# Patient Record
Sex: Male | Born: 1945 | Race: White | Hispanic: No | Marital: Married | Smoking: Never smoker
Health system: Southern US, Community
[De-identification: ages and names within clinical notes are randomized; demographics above are authoritative.]

## PROBLEM LIST (undated history)

## (undated) DIAGNOSIS — I10 Essential (primary) hypertension: Secondary | ICD-10-CM

---

## 2020-03-04 ENCOUNTER — Inpatient Hospital Stay (HOSPITAL_COMMUNITY)
Admission: EM | Admit: 2020-03-04 | Discharge: 2020-03-10 | DRG: 177 | Disposition: A | Discharge status: Home / Self Care | Payer: No Typology Code available for payment source | Attending: Internal Medicine | Admitting: Internal Medicine

## 2020-03-04 ENCOUNTER — Other Ambulatory Visit: Payer: Self-pay

## 2020-03-04 ENCOUNTER — Emergency Department (HOSPITAL_COMMUNITY): Payer: No Typology Code available for payment source

## 2020-03-04 DIAGNOSIS — J1282 Pneumonia due to coronavirus disease 2019: Secondary | ICD-10-CM | POA: Diagnosis present

## 2020-03-04 DIAGNOSIS — R0602 Shortness of breath: Secondary | ICD-10-CM

## 2020-03-04 DIAGNOSIS — J9600 Acute respiratory failure, unspecified whether with hypoxia or hypercapnia: Secondary | ICD-10-CM | POA: Diagnosis present

## 2020-03-04 DIAGNOSIS — E871 Hypo-osmolality and hyponatremia: Secondary | ICD-10-CM | POA: Diagnosis present

## 2020-03-04 DIAGNOSIS — R7989 Other specified abnormal findings of blood chemistry: Secondary | ICD-10-CM | POA: Diagnosis not present

## 2020-03-04 DIAGNOSIS — T380X5A Adverse effect of glucocorticoids and synthetic analogues, initial encounter: Secondary | ICD-10-CM | POA: Diagnosis not present

## 2020-03-04 DIAGNOSIS — Z6838 Body mass index (BMI) 38.0-38.9, adult: Secondary | ICD-10-CM | POA: Diagnosis not present

## 2020-03-04 DIAGNOSIS — E876 Hypokalemia: Secondary | ICD-10-CM | POA: Diagnosis present

## 2020-03-04 DIAGNOSIS — U071 COVID-19: Principal | ICD-10-CM

## 2020-03-04 DIAGNOSIS — I10 Essential (primary) hypertension: Secondary | ICD-10-CM | POA: Diagnosis present

## 2020-03-04 DIAGNOSIS — K219 Gastro-esophageal reflux disease without esophagitis: Secondary | ICD-10-CM | POA: Diagnosis present

## 2020-03-04 DIAGNOSIS — E860 Dehydration: Secondary | ICD-10-CM | POA: Diagnosis present

## 2020-03-04 DIAGNOSIS — Z23 Encounter for immunization: Secondary | ICD-10-CM | POA: Diagnosis not present

## 2020-03-04 DIAGNOSIS — D72829 Elevated white blood cell count, unspecified: Secondary | ICD-10-CM | POA: Diagnosis not present

## 2020-03-04 DIAGNOSIS — E785 Hyperlipidemia, unspecified: Secondary | ICD-10-CM | POA: Diagnosis present

## 2020-03-04 DIAGNOSIS — J9601 Acute respiratory failure with hypoxia: Secondary | ICD-10-CM | POA: Diagnosis present

## 2020-03-04 DIAGNOSIS — R0902 Hypoxemia: Secondary | ICD-10-CM

## 2020-03-04 DIAGNOSIS — E669 Obesity, unspecified: Secondary | ICD-10-CM | POA: Diagnosis present

## 2020-03-04 HISTORY — DX: Essential (primary) hypertension: I10

## 2020-03-04 LAB — COMPREHENSIVE METABOLIC PANEL
ALT: 33 U/L (ref 0–44)
AST: 69 U/L — ABNORMAL HIGH (ref 15–41)
Albumin: 3.2 g/dL — ABNORMAL LOW (ref 3.5–5.0)
Alkaline Phosphatase: 102 U/L (ref 38–126)
Anion gap: 15 (ref 5–15)
BUN: 9 mg/dL (ref 8–23)
CO2: 22 mmol/L (ref 22–32)
Calcium: 9.1 mg/dL (ref 8.9–10.3)
Chloride: 90 mmol/L — ABNORMAL LOW (ref 98–111)
Creatinine, Ser: 1.12 mg/dL (ref 0.61–1.24)
GFR, Estimated: >60 mL/min (ref >60)
Glucose, Bld: 99 mg/dL (ref 70–99)
Potassium: 3.3 mmol/L — ABNORMAL LOW (ref 3.5–5.1)
Sodium: 127 mmol/L — ABNORMAL LOW (ref 135–145)
Total Bilirubin: 1 mg/dL (ref 0.3–1.2)
Total Protein: 6.9 g/dL (ref 6.5–8.1)

## 2020-03-04 LAB — C-REACTIVE PROTEIN: CRP: 11.3 mg/dL — ABNORMAL HIGH (ref <1.0)

## 2020-03-04 LAB — CBC WITH DIFFERENTIAL/PLATELET
Abs Immature Granulocytes: 0 10*3/uL (ref 0.00–0.07)
Basophils Absolute: 0.1 10*3/uL (ref 0.0–0.1)
Basophils Relative: 1 %
Eosinophils Absolute: 0 10*3/uL (ref 0.0–0.5)
Eosinophils Relative: 0 %
HCT: 47.5 % (ref 39.0–52.0)
Hemoglobin: 16.3 g/dL (ref 13.0–17.0)
Lymphocytes Relative: 14 %
Lymphs Abs: 0.7 10*3/uL (ref 0.7–4.0)
MCH: 31 pg (ref 26.0–34.0)
MCHC: 34.3 g/dL (ref 30.0–36.0)
MCV: 90.3 fL (ref 80.0–100.0)
Monocytes Absolute: 0.3 10*3/uL (ref 0.1–1.0)
Monocytes Relative: 6 %
Neutro Abs: 4 10*3/uL (ref 1.7–7.7)
Neutrophils Relative %: 79 %
Platelets: 161 10*3/uL (ref 150–400)
RBC: 5.26 MIL/uL (ref 4.22–5.81)
RDW: 11.4 % — ABNORMAL LOW (ref 11.5–15.5)
WBC: 5.1 10*3/uL (ref 4.0–10.5)
nRBC: 0 % (ref 0.0–0.2)
nRBC: 0 /100 WBC

## 2020-03-04 LAB — FERRITIN: Ferritin: 1204 ng/mL — ABNORMAL HIGH (ref 24–336)

## 2020-03-04 LAB — D-DIMER, QUANTITATIVE: D-Dimer, Quant: 1.92 ug/mL-FEU — ABNORMAL HIGH (ref 0.00–0.50)

## 2020-03-04 LAB — RESP PANEL BY RT-PCR (FLU A&B, COVID) ARPGX2
Influenza A by PCR: NEGATIVE
Influenza B by PCR: NEGATIVE
SARS Coronavirus 2 by RT PCR: POSITIVE — AB

## 2020-03-04 LAB — FIBRINOGEN: Fibrinogen: 652 mg/dL — ABNORMAL HIGH (ref 210–475)

## 2020-03-04 LAB — LACTIC ACID, PLASMA: Lactic Acid, Venous: 1.7 mmol/L (ref 0.5–1.9)

## 2020-03-04 LAB — LACTATE DEHYDROGENASE: LDH: 480 U/L — ABNORMAL HIGH (ref 98–192)

## 2020-03-04 LAB — TRIGLYCERIDES: Triglycerides: 110 mg/dL (ref <150)

## 2020-03-04 MED ORDER — SODIUM CHLORIDE 0.9 % IV SOLN
100.0000 mg | Freq: Every day | INTRAVENOUS | Status: AC
  Administered 2020-03-05 – 2020-03-08 (×4): 100 mg via INTRAVENOUS
  Filled 2020-03-04 (×5): qty 20

## 2020-03-04 MED ORDER — SODIUM CHLORIDE 0.9 % IV SOLN
200.0000 mg | Freq: Once | INTRAVENOUS | Status: AC
  Administered 2020-03-05: 200 mg via INTRAVENOUS
  Filled 2020-03-04: qty 40

## 2020-03-04 MED ORDER — METHYLPREDNISOLONE SODIUM SUCC 125 MG IJ SOLR
50.0000 mg | Freq: Once | INTRAMUSCULAR | Status: AC
  Administered 2020-03-05: 50 mg via INTRAVENOUS
  Filled 2020-03-04: qty 2

## 2020-03-04 NOTE — ED Provider Notes (Signed)
[facility] EMERGENCY DEPARTMENT Provider Note   CSN: [id number] Arrival date & time: 03/04/20  1927     History Chief Complaint  Patient presents with  . Covid Exposure  . Shortness of Breath    Adam Edwards is a 74 y.o. male who presents to the ED today via EMS with complaint of gradual onset, constant, worsening, shortness of breath x 3 days. Per EMS pt's wife tested positive for COVID 19 last night; pt and wife are both unvaccinated. Pt complains of cough and fevers as well. No complains of chest pain, abdominal pain, diarrhea. 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 Pt is not normally on O2. He was placed on 12 L [place] with EMS and only satting about 84%. When pt came to the ED he was placed on 10L NRB with improvement in symptoms. EMS reports pt was febrile with them however did not receive any tylenol - he was provided with a small amount of fluids and when he came to the ED temp 99.4.   The history is provided by the patient, the EMS personnel and medical records.       No past medical history on file.  Patient Active Problem List   Diagnosis Date Noted  . Acute respiratory failure (HCC) 03/04/2020      No family history on file.  Social History   Tobacco Use  . Smoking status: Not on file  Substance Use Topics  . Alcohol use: Not on file  . Drug use: Not on file    Home Medications Prior to Admission medications   Not on File    Allergies    Patient has no allergy information on record.  Review of Systems   Review of Systems  Constitutional: Positive for fever.  Respiratory: Positive for cough and shortness of breath.   Cardiovascular: Negative for chest pain.  Gastrointestinal: Negative for abdominal pain and diarrhea.  All other systems reviewed and are negative.   Physical Exam Updated Vital Signs BP 106/74 (BP Location: Right Arm)   Pulse 74   Temp 99.4 F (37.4 C) (Oral)   Resp (!) 36    Ht 5\' 3"  (1.6 m)   Wt 99.8 kg   SpO2 95%   BMI 38.97 kg/m   Physical Exam Vitals and nursing note reviewed.  Constitutional:      Appearance: He is not ill-appearing or diaphoretic.  HENT:     Head: Normocephalic and atraumatic.  Eyes:     Conjunctiva/sclera: Conjunctivae normal.  Cardiovascular:     Rate and Rhythm: Normal rate and regular rhythm.     Pulses: Normal pulses.  Pulmonary:     Effort: Pulmonary effort is normal.     Breath sounds: Decreased breath sounds present. No wheezing, rhonchi or rales.     Comments: Speaking in 1 word sentences on 10L NRB, satting 91%. Diminished breath sounds throughout.  Abdominal:     Palpations: Abdomen is soft.     Tenderness: There is no abdominal tenderness.  Musculoskeletal:     Cervical back: Neck supple.  Skin:    General: Skin is warm and dry.  Neurological:     Mental Status: He is alert.     ED Results / Procedures / Treatments   Labs (all labs ordered are listed, but only abnormal results are displayed) Labs Reviewed  RESP PANEL BY RT-PCR (FLU A&B, COVID) ARPGX2 - Abnormal; Notable for the  following components:      Result Value   SARS Coronavirus 2 by RT PCR POSITIVE (*)    All other components within normal limits  CBC WITH DIFFERENTIAL/PLATELET - Abnormal; Notable for the following components:   RDW 11.4 (*)    All other components within normal limits  COMPREHENSIVE METABOLIC PANEL - Abnormal; Notable for the following components:   Sodium 127 (*)    Potassium 3.3 (*)    Chloride 90 (*)    Albumin 3.2 (*)    AST 69 (*)    All other components within normal limits  D-DIMER, QUANTITATIVE (NOT AT [facility]) - Abnormal; Notable for the following components:   D-Dimer, Quant 1.92 (*)    All other components within normal limits  LACTATE DEHYDROGENASE - Abnormal; Notable for the following components:   LDH 480 (*)    All other components within normal limits  FERRITIN - Abnormal; Notable for the following  components:   Ferritin 1,204 (*)    All other components within normal limits  FIBRINOGEN - Abnormal; Notable for the following components:   Fibrinogen 652 (*)    All other components within normal limits  C-REACTIVE PROTEIN - Abnormal; Notable for the following components:   CRP 11.3 (*)    All other components within normal limits  CULTURE, BLOOD (ROUTINE X 2)  CULTURE, BLOOD (ROUTINE X 2)  LACTIC ACID, PLASMA  TRIGLYCERIDES  LACTIC ACID, PLASMA  PROCALCITONIN    EKG None  Radiology DG Chest Port 1 View  Result Date: 03/04/2020 CLINICAL DATA:  Shortness of breath for 3 days. History of COVID-19 exposure. EXAM: PORTABLE CHEST 1 VIEW COMPARISON:  None. FINDINGS: The patient has bilateral airspace disease with a peripheral predominance. Heart size is normal. No pneumothorax or pleural effusion. IMPRESSION: Bilateral pneumonia with an appearance compatible with COVID-19 infection. Electronically Signed   By: Drusilla Kanner M.D.   On: 03/04/2020 20:32    Procedures .Critical Care Performed by: Tanda Rockers, PA-C Authorized by: Tanda Rockers, PA-C   Critical care provider statement:    Critical care time (minutes):  45   Critical care was necessary to treat or prevent imminent or life-threatening deterioration of the following conditions:  Respiratory failure   Critical care was time spent personally by me on the following activities:  Discussions with consultants, evaluation of patient's response to treatment, examination of patient, ordering and performing treatments and interventions, ordering and review of laboratory studies, ordering and review of radiographic studies, pulse oximetry, re-evaluation of patient's condition, obtaining history from patient or surrogate and review of old charts   (including critical care time)  Medications Ordered in ED Medications  methylPREDNISolone sodium succinate (SOLU-MEDROL) 125 mg/2 mL injection 50 mg (has no administration in time  range)    ED Course  I have reviewed the triage vital signs and the nursing notes.  Pertinent labs & imaging results that were available during my care of the patient were reviewed by me and considered in my medical decision making (see chart for details).  Clinical Course as of Mar 04 2258  Thu Mar 04, 2020  2227 SARS Coronavirus 2 by RT PCR(!): POSITIVE [MV]    Clinical Course User Index [MV] Tanda Rockers, PA-C   MDM Rules/Calculators/A&P                          74 year old male who presents to the ED today via EMS for worsening shortness of breath for  the past 3 days, wife recently tested positive for COVID-19 and patient began feeling ill 3 days ago with shortness of breath, cough, fevers.  He is unvaccinated.  Found to be hypoxic with fire department at 70% on room air, he presents to the ED on 12 L nasal cannula satting about 84%.  Quickly placed on 10 L nonrebreather with improvement.  Patient able speak in one-word sentences due to his significant shortness of breath.  On exam he has diminished breath sounds throughout, no wheezes, no rales, no rhonchi.  He denies any chest pain or abdominal pain.  On arrival his temp is 99.4, nontachycardic, tachypneic at 36.  Blood pressure slightly low at 106/74.  Will swab for Covid at this time and obtain Covid labs, strong suspicion given recent positive exposure with wife and patient being unvaccinated with symptoms of hypoxia.  Patient will need to be admitted.  CXR consistent with COVID pneumonia. Still awaiting COVID results at this time CBC without leukocytosis. Hgb stable at 16.3 CMP with sodium 127 and potassium 3.3, chloride 90. Do not have baseline to compare to. Creatinine within normal limits.  D dimer, fibrinogen, ferritin, and CRP elevated  COVID has returned positive Discussed case with [facility] Dr. Toniann Fail who agrees to accept patient for admission. Recommends 0.5 mg/kg solumedrol and remdisivir.   This note  was prepared using Dragon voice recognition software and may include unintentional dictation errors due to the inherent limitations of voice recognition software.  Christie Copley was evaluated in Emergency Department on 03/04/2020 for the symptoms described in the history of present illness. He was evaluated in the context of the global COVID-19 pandemic, which necessitated consideration that the patient might be at risk for infection with the SARS-CoV-2 virus that causes COVID-19. Institutional protocols and algorithms that pertain to the evaluation of patients at risk for COVID-19 are in a state of rapid change based on information released by regulatory bodies including the CDC and federal and state organizations. These policies and algorithms were followed during the patient's care in the ED.   Final Clinical Impression(s) / ED Diagnoses Final diagnoses:  COVID-19  Hypoxia    Rx / DC Orders ED Discharge Orders    None       Tanda Rockers, PA-C 03/04/20 2300    Benjiman Core, MD 03/05/20 [phone]

## 2020-03-04 NOTE — ED Triage Notes (Signed)
Pt arrives to ED BIB [facility] for [facility] after being exposed to wife who is covid positive. Per EMS pt began experiencing SHOB x3 days and it worsened today. Per EMS upon their arrival pt was sating at 75% RA. EMS placed pt on 6L [place], sats went up to 84% and pt was then put on 15L NRB bringing sats up to mid 90's. Upon arrival pt's sats were assessed on RA, sats dropped to 80%. Pt currently on 10L NRB with sats of 99%. Pt A/O x4. EDP at bedside upon pt's arrival.

## 2020-03-04 NOTE — ED Notes (Signed)
Pt's family member's have been updated on pt's care and status. Please call for any updates regarding pt. Turner Daniels (Daughter) [phone]. Adria Dill (Daughter) [phone].

## 2020-03-05 ENCOUNTER — Other Ambulatory Visit: Payer: Self-pay

## 2020-03-05 ENCOUNTER — Inpatient Hospital Stay (HOSPITAL_COMMUNITY): Payer: No Typology Code available for payment source

## 2020-03-05 ENCOUNTER — Encounter (HOSPITAL_COMMUNITY): Payer: Self-pay | Admitting: Internal Medicine

## 2020-03-05 DIAGNOSIS — J9601 Acute respiratory failure with hypoxia: Secondary | ICD-10-CM

## 2020-03-05 DIAGNOSIS — U071 COVID-19: Principal | ICD-10-CM

## 2020-03-05 DIAGNOSIS — J9600 Acute respiratory failure, unspecified whether with hypoxia or hypercapnia: Secondary | ICD-10-CM

## 2020-03-05 DIAGNOSIS — R7989 Other specified abnormal findings of blood chemistry: Secondary | ICD-10-CM

## 2020-03-05 DIAGNOSIS — I10 Essential (primary) hypertension: Secondary | ICD-10-CM

## 2020-03-05 LAB — CBC WITH DIFFERENTIAL/PLATELET
Abs Immature Granulocytes: 0.11 10*3/uL — ABNORMAL HIGH (ref 0.00–0.07)
Basophils Absolute: 0 10*3/uL (ref 0.0–0.1)
Basophils Relative: 1 %
Eosinophils Absolute: 0 10*3/uL (ref 0.0–0.5)
Eosinophils Relative: 0 %
HCT: 40 % (ref 39.0–52.0)
Hemoglobin: 14.8 g/dL (ref 13.0–17.0)
Immature Granulocytes: 3 %
Lymphocytes Relative: 14 %
Lymphs Abs: 0.6 10*3/uL — ABNORMAL LOW (ref 0.7–4.0)
MCH: 32 pg (ref 26.0–34.0)
MCHC: 37 g/dL — ABNORMAL HIGH (ref 30.0–36.0)
MCV: 86.6 fL (ref 80.0–100.0)
Monocytes Absolute: 0.4 10*3/uL (ref 0.1–1.0)
Monocytes Relative: 9 %
Neutro Abs: 3.3 10*3/uL (ref 1.7–7.7)
Neutrophils Relative %: 73 %
Platelets: 174 10*3/uL (ref 150–400)
RBC: 4.62 MIL/uL (ref 4.22–5.81)
RDW: 11.5 % (ref 11.5–15.5)
WBC: 4.4 10*3/uL (ref 4.0–10.5)
nRBC: 0 % (ref 0.0–0.2)

## 2020-03-05 LAB — COMPREHENSIVE METABOLIC PANEL
ALT: 30 U/L (ref 0–44)
AST: 59 U/L — ABNORMAL HIGH (ref 15–41)
Albumin: 2.7 g/dL — ABNORMAL LOW (ref 3.5–5.0)
Alkaline Phosphatase: 88 U/L (ref 38–126)
Anion gap: 14 (ref 5–15)
BUN: 11 mg/dL (ref 8–23)
CO2: 22 mmol/L (ref 22–32)
Calcium: 8.5 mg/dL — ABNORMAL LOW (ref 8.9–10.3)
Chloride: 91 mmol/L — ABNORMAL LOW (ref 98–111)
Creatinine, Ser: 1.09 mg/dL (ref 0.61–1.24)
GFR, Estimated: >60 mL/min (ref >60)
Glucose, Bld: 136 mg/dL — ABNORMAL HIGH (ref 70–99)
Potassium: 3.3 mmol/L — ABNORMAL LOW (ref 3.5–5.1)
Sodium: 127 mmol/L — ABNORMAL LOW (ref 135–145)
Total Bilirubin: 0.9 mg/dL (ref 0.3–1.2)
Total Protein: 5.8 g/dL — ABNORMAL LOW (ref 6.5–8.1)

## 2020-03-05 LAB — D-DIMER, QUANTITATIVE: D-Dimer, Quant: 2 ug/mL-FEU — ABNORMAL HIGH (ref 0.00–0.50)

## 2020-03-05 LAB — PROCALCITONIN
Procalcitonin: 0.19 ng/mL
Procalcitonin: 0.19 ng/mL

## 2020-03-05 LAB — C-REACTIVE PROTEIN: CRP: 11.1 mg/dL — ABNORMAL HIGH (ref <1.0)

## 2020-03-05 LAB — TROPONIN I (HIGH SENSITIVITY)
Troponin I (High Sensitivity): 33 ng/L — ABNORMAL HIGH (ref <18)
Troponin I (High Sensitivity): 36 ng/L — ABNORMAL HIGH (ref <18)

## 2020-03-05 LAB — LACTIC ACID, PLASMA: Lactic Acid, Venous: 1.7 mmol/L (ref 0.5–1.9)

## 2020-03-05 MED ORDER — IOHEXOL 350 MG/ML SOLN
60.0000 mL | Freq: Once | INTRAVENOUS | Status: AC | PRN
  Administered 2020-03-05: 60 mL via INTRAVENOUS

## 2020-03-05 MED ORDER — ENOXAPARIN SODIUM 40 MG/0.4ML ~~LOC~~ SOLN
40.0000 mg | SUBCUTANEOUS | Status: DC
  Administered 2020-03-05: 40 mg via SUBCUTANEOUS
  Filled 2020-03-05: qty 0.4

## 2020-03-05 MED ORDER — ACETAMINOPHEN 325 MG PO TABS
650.0000 mg | ORAL_TABLET | Freq: Four times a day (QID) | ORAL | Status: DC | PRN
  Administered 2020-03-05 – 2020-03-06 (×2): 650 mg via ORAL
  Filled 2020-03-05 (×2): qty 2

## 2020-03-05 MED ORDER — ACETAMINOPHEN 650 MG RE SUPP: 650.0000 mg | Freq: Four times a day (QID) | RECTAL | Status: DC | PRN

## 2020-03-05 MED ORDER — HYDROCOD POLST-CPM POLST ER 10-8 MG/5ML PO SUER
5.0000 mL | Freq: Two times a day (BID) | ORAL | Status: DC | PRN
  Administered 2020-03-06: 5 mL via ORAL
  Filled 2020-03-05: qty 5

## 2020-03-05 MED ORDER — METHYLPREDNISOLONE SODIUM SUCC 125 MG IJ SOLR
0.5000 mg/kg | Freq: Two times a day (BID) | INTRAMUSCULAR | Status: DC
  Administered 2020-03-05 – 2020-03-06 (×2): 50 mg via INTRAVENOUS
  Filled 2020-03-05 (×2): qty 2

## 2020-03-05 MED ORDER — ZINC SULFATE 220 (50 ZN) MG PO CAPS
220.0000 mg | ORAL_CAPSULE | Freq: Every day | ORAL | Status: DC
  Administered 2020-03-05 – 2020-03-10 (×6): 220 mg via ORAL
  Filled 2020-03-05 (×6): qty 1

## 2020-03-05 MED ORDER — PANTOPRAZOLE SODIUM 40 MG PO TBEC
40.0000 mg | DELAYED_RELEASE_TABLET | Freq: Every day | ORAL | Status: DC
  Administered 2020-03-05 – 2020-03-10 (×6): 40 mg via ORAL
  Filled 2020-03-05 (×6): qty 1

## 2020-03-05 MED ORDER — POTASSIUM CHLORIDE CRYS ER 20 MEQ PO TBCR
20.0000 meq | EXTENDED_RELEASE_TABLET | Freq: Once | ORAL | Status: DC
  Filled 2020-03-05: qty 1

## 2020-03-05 MED ORDER — SODIUM CHLORIDE 0.9 % IV SOLN: INTRAVENOUS | Status: DC

## 2020-03-05 MED ORDER — TOCILIZUMAB 400 MG/20ML IV SOLN
8.0000 mg/kg | Freq: Once | INTRAVENOUS | Status: AC
  Administered 2020-03-05: 798 mg via INTRAVENOUS
  Filled 2020-03-05: qty 39.9

## 2020-03-05 MED ORDER — PREDNISONE 20 MG PO TABS: 50.0000 mg | ORAL_TABLET | Freq: Every day | ORAL | Status: DC

## 2020-03-05 MED ORDER — GUAIFENESIN-DM 100-10 MG/5ML PO SYRP
10.0000 mL | ORAL_SOLUTION | ORAL | Status: DC | PRN
  Administered 2020-03-06: 10 mL via ORAL
  Filled 2020-03-05: qty 10

## 2020-03-05 MED ORDER — ASCORBIC ACID 500 MG PO TABS
500.0000 mg | ORAL_TABLET | Freq: Every day | ORAL | Status: DC
  Administered 2020-03-05 – 2020-03-10 (×6): 500 mg via ORAL
  Filled 2020-03-05 (×6): qty 1

## 2020-03-05 MED ORDER — HYDRALAZINE HCL 20 MG/ML IJ SOLN: 10.0000 mg | INTRAMUSCULAR | Status: DC | PRN

## 2020-03-05 MED ORDER — ENOXAPARIN SODIUM 40 MG/0.4ML ~~LOC~~ SOLN
40.0000 mg | Freq: Two times a day (BID) | SUBCUTANEOUS | Status: DC
  Administered 2020-03-06: 40 mg via SUBCUTANEOUS
  Filled 2020-03-05: qty 0.4

## 2020-03-05 MED ORDER — POTASSIUM CHLORIDE CRYS ER 20 MEQ PO TBCR
40.0000 meq | EXTENDED_RELEASE_TABLET | Freq: Once | ORAL | Status: AC
  Administered 2020-03-05: 40 meq via ORAL
  Filled 2020-03-05: qty 2

## 2020-03-05 NOTE — Progress Notes (Signed)
Bilateral lower extremity venous duplex has been completed. Preliminary results can be found in CV Proc through chart review.   03/05/20 3:22 PM Olen Cordial RVT

## 2020-03-05 NOTE — Progress Notes (Addendum)
PROGRESS NOTE                                                                                                                                                                                                             Patient Demographics:    Adam Edwards, is a 74 y.o. male, DOB - 02-Feb-1946, ZOX:[id number]  Outpatient Primary MD for the patient is Adam Schaumann, MD   Admit date - 03/04/2020   LOS - 1  Chief Complaint  Patient presents with  . Covid Exposure  . Shortness of Breath       Brief Narrative: Patient is a 74 y.o. male with PMHx of HTN-presented to the hospital with 3-day history of shortness of breath-patient was found to have severe hypoxic respiratory failure due to COVID-19 pneumonia requiring HFNC/NRB-subsequently admitted to the hospitalist service.  Patient's wife is also Covid positive.  COVID-19 vaccinated status: Unvaccinated  Significant Events: 12/2>> presented to [facility] for SOB-severe hypoxia due to COVID-19 infection.  Significant studies: 12/3>> CTA chest: No PE, extensive bilateral pulmonary infiltrates.  COVID-19 medications: Steroids: 12/2>> Remdesivir: 12/2>> Actemra: 12/2 x 1  Antibiotics: None  Microbiology data: 12/2 >>blood culture: No growth 12/3>> blood culture: Pending  Procedures: None  Consults: None  DVT prophylaxis: Prophylactic Lovenox at intermediate dosing.   Subjective:    Adam Edwards today appears comfortable-on 15 L of HFNC along with 15 L via NRB.   Assessment  & Plan :   Acute Hypoxic Resp Failure due to Covid 19 Viral pneumonia: Has severe hypoxemia due to severe parenchymal infiltrates due to COVID-19 pneumonia.  Continue steroids/Remdesivir.  S/p Actemra on admission.  No evidence of volume overload-does not require diuretics.  Fever: afebrile O2 requirements:  SpO2: 100 % O2 Flow Rate (L/min): 10 L/min   COVID-19 Labs: Recent Labs     03/04/20 1933 03/05/20 0401  DDIMER 1.92* 2.00*  FERRITIN 1,204*  --   LDH 480*  --   CRP 11.3* 11.1*    No results found for: BNP  Recent Labs  Lab 03/04/20 1933 03/05/20 0401  PROCALCITON 0.19 0.19    Lab Results  Component Value Date   SARSCOV2NAA POSITIVE (A) 03/04/2020     Prone/Incentive Spirometry: encouraged patient to lie prone for 3-4 hours at a time for a total of 16 hours a day, and to  encourage incentive spirometry use 3-4/hour.  Elevated D-dimer levels: Secondary to COVID-19 related inflammation-check lower extremity Dopplers, CTA chest negative.  Change Lovenox to twice daily dosing.  Hyponatremia: Etiology uncertain-either due to dehydration in the setting of COVID-19 infection or SIADH due to COVID-19.  Volume status is stable.  We will try gentle hydration overnight to see if sodium levels improve.  If not-we can consider further work-up and or treatment with [place].  Hypokalemia: Replete and recheck  HTN: BP stable-no indication for hypertensive at this point.  Unclear whether he is on antihypertensives at home.  Obesity: Estimated body mass index is 38.97 kg/m as calculated from the following:   Height as of this encounter: 5\' 3"  (1.6 m).   Weight as of this encounter: 99.8 kg.   GI prophylaxis: PPI  ABG: No results found for: PHART, PCO2ART, PO2ART, HCO3, TCO2, ACIDBASEDEF, O2SAT  Vent Settings: N/A  Condition - Extremely Guarded-very tenuous with risk for further deterioration  Family Communication  :  Daughter [phone])  updated over the phone on 12/3  Code Status :  Full Code  Diet :  Diet Order            Diet Heart Room service appropriate? Yes; Fluid consistency: Thin  Diet effective now                  Disposition Plan  :   Status is: Inpatient  Remains inpatient appropriate because:Inpatient level of care appropriate due to severity of illness   Dispo: The patient is from: Home               Anticipated d/c is to: Home              Anticipated d/c date is: > 3 days              Patient currently is not medically stable to d/c.  Barriers to discharge: Hypoxia requiring O2 supplementation/complete 5 days of IV Remdesivir  Antimicorbials  :    Anti-infectives (From admission, onward)   Start     Dose/Rate Route Frequency Ordered Stop   03/05/20 1000  remdesivir 100 mg in sodium chloride 0.9 % 100 mL IVPB       "Followed by" Linked Group Details   100 mg 200 mL/hr over 30 Minutes Intravenous Daily 03/04/20 2305 03/09/20 0959   03/04/20 2315  remdesivir 200 mg in sodium chloride 0.9% 250 mL IVPB       "Followed by" Linked Group Details   200 mg 580 mL/hr over 30 Minutes Intravenous Once 03/04/20 2305 03/05/20 0200      Inpatient Medications  Scheduled Meds: . vitamin C  500 mg Oral Daily  . enoxaparin (LOVENOX) injection  40 mg Subcutaneous Q24H  . methylPREDNISolone (SOLU-MEDROL) injection  0.5 mg/kg Intravenous Q12H   Followed by  . [START ON 03/08/2020] predniSONE  50 mg Oral Daily  . zinc sulfate  220 mg Oral Daily   Continuous Infusions: . remdesivir 100 mg in NS 100 mL Stopped (03/05/20 1021)   PRN Meds:.acetaminophen **OR** acetaminophen, chlorpheniramine-HYDROcodone, guaiFENesin-dextromethorphan, hydrALAZINE   Time Spent in minutes  35   See all Orders from today for further details   14/03/21 M.D on 03/05/2020 at 10:36 AM  To page go to [email or web address] - use universal password  [facility] -  Office  [phone]    Objective:   Vitals:   03/05/20 0915 03/05/20 0930 03/05/20 0945 03/05/20 1030  BP: 126/76 136/79  118/71 120/69  Pulse: 92 98 85 85  Resp: Temp:      TempSrc:      SpO2: 98% (!) 88% 99% 100%  Weight:      Height:        Wt Readings from Last 3 Encounters:  03/04/20 99.8 kg     Intake/Output Summary (Last 24 hours) at 03/05/2020 1036 Last data filed at 03/05/2020 1021 Gross per 24 hour  Intake 100  ml  Output --  Net 100 ml     Physical Exam Gen Exam:Alert awake-not in any distress HEENT:atraumatic, normocephalic Chest: B/L clear to auscultation anteriorly CVS:S1S2 regular Abdomen:soft non tender, non distended Extremities:no edema Neurology: Non focal Skin: no rash   Data Review:    CBC Recent Labs  Lab 03/04/20 1933 03/05/20 0401  WBC 5.1 4.4  HGB 16.3 14.8  HCT 47.5 40.0  PLT 161 174  MCV 90.3 86.6  MCH 31.0 32.0  MCHC 34.3 37.0*  RDW 11.4* 11.5  LYMPHSABS 0.7 0.6*  MONOABS 0.3 0.4  EOSABS 0.0 0.0  BASOSABS 0.1 0.0    Chemistries  Recent Labs  Lab 03/04/20 1933 03/05/20 0401  NA 127* 127*  K 3.3* 3.3*  CL 90* 91*  CO2 22 22  GLUCOSE 99 136*  BUN 9 11  CREATININE 1.12 1.09  CALCIUM 9.1 8.5*  AST 69* 59*  ALT 33 30  ALKPHOS 102 88  BILITOT 1.0 0.9   ------------------------------------------------------------------------------------------------------------------ Recent Labs    03/04/20 1933  TRIG 110    No results found for: HGBA1C ------------------------------------------------------------------------------------------------------------------ No results for input(s): TSH, T4TOTAL, T3FREE, THYROIDAB in the last 72 hours.  Invalid input(s): FREET3 ------------------------------------------------------------------------------------------------------------------ Recent Labs    03/04/20 1933  FERRITIN 1,204*    Coagulation profile No results for input(s): INR, PROTIME in the last 168 hours.  Recent Labs    03/04/20 1933 03/05/20 0401  DDIMER 1.92* 2.00*    Cardiac Enzymes No results for input(s): CKMB, TROPONINI, MYOGLOBIN in the last 168 hours.  Invalid input(s): CK ------------------------------------------------------------------------------------------------------------------ No results found for: BNP  Micro Results Recent Results (from the past 240 hour(s))  Resp Panel by RT-PCR (Flu A&B, Covid) Nasopharyngeal Swab      Status: Abnormal   Collection Time: 03/04/20  7:33 PM   Specimen: Nasopharyngeal Swab; Nasopharyngeal(NP) swabs in vial transport medium  Result Value Ref Range Status   SARS Coronavirus 2 by RT PCR POSITIVE (A) NEGATIVE Final    Comment: RESULT CALLED TO, READ BACK BY AND VERIFIED WITH: Andree Elk RN 03/04/20 AT 2153 SK (NOTE) SARS-CoV-2 target nucleic acids are DETECTED.  The SARS-CoV-2 RNA is generally detectable in upper respiratory specimens during the acute phase of infection. Positive results are indicative of the presence of the identified virus, but do not rule out bacterial infection or co-infection with other pathogens not detected by the test. Clinical correlation with patient history and other diagnostic information is necessary to determine patient infection status. The expected result is Negative.  Fact Sheet for Patients: BloggerCourse.com  Fact Sheet for Healthcare Providers: SeriousBroker.it  This test is not yet approved or cleared by the [place] FDA and  has been authorized for detection and/or diagnosis of SARS-CoV-2 by FDA under an Emergency Use Authorization (EUA).  This EUA will remain in effect (meaning this te st can be used) for the duration of  the COVID-19 declaration under Section 564(b)(1) of the Act, 21 U.S.C. section 360bbb-3(b)(1), unless the authorization is terminated  or revoked sooner.     Influenza A by PCR NEGATIVE NEGATIVE Final   Influenza B by PCR NEGATIVE NEGATIVE Final    Comment: (NOTE) The Xpert Xpress SARS-CoV-2/FLU/RSV plus assay is intended as an aid in the diagnosis of influenza from Nasopharyngeal swab specimens and should not be used as a sole basis for treatment. Nasal washings and aspirates are unacceptable for Xpert Xpress SARS-CoV-2/FLU/RSV testing.  Fact Sheet for Patients: BloggerCourse.com  Fact Sheet for Healthcare  Providers: SeriousBroker.it  This test is not yet approved or cleared by the [place] FDA and has been authorized for detection and/or diagnosis of SARS-CoV-2 by FDA under an Emergency Use Authorization (EUA). This EUA will remain in effect (meaning this test can be used) for the duration of the COVID-19 declaration under Section 564(b)(1) of the Act, 21 U.S.C. section 360bbb-3(b)(1), unless the authorization is terminated or revoked.  Performed at [facility], [address], [place], [place] [address]   Blood Culture (routine x 2)     Status: None (Preliminary result)   Collection Time: 03/04/20  8:45 PM   Specimen: BLOOD LEFT HAND  Result Value Ref Range Status   Specimen Description BLOOD LEFT HAND  Final   Special Requests   Final    AEROBIC BOTTLE ONLY Blood Culture results may not be optimal due to an inadequate volume of blood received in culture bottles   Culture   Final    NO GROWTH < 12 HOURS Performed at [facility], [address] [place], [place] [address]    Report Status PENDING  Incomplete    Radiology Reports CT ANGIO CHEST PE W OR WO CONTRAST  Result Date: 03/05/2020 CLINICAL DATA:  Pulmonary embolism, dyspnea, COVID pneumonia EXAM: CT ANGIOGRAPHY CHEST WITH CONTRAST TECHNIQUE: Multidetector CT imaging of the chest was performed using the standard protocol during bolus administration of intravenous contrast. Multiplanar CT image reconstructions and MIPs were obtained to evaluate the vascular anatomy. CONTRAST:  42mL OMNIPAQUE IOHEXOL 350 MG/ML SOLN COMPARISON:  None. FINDINGS: Cardiovascular: There is adequate opacification of the pulmonary arterial tree. No intraluminal filling defect identified to suggest acute pulmonary embolism. The pulmonary arteries are of normal caliber. Mild coronary artery calcification. Global cardiac size within normal limits. No pericardial effusion. Thoracic aorta demonstrates mild  atherosclerotic calcification within the arch. No aortic aneurysm. Mediastinum/Nodes: No enlarged mediastinal, hilar, or axillary lymph nodes. Thyroid gland, trachea, and esophagus demonstrate no significant findings. Small hiatal hernia noted. Lungs/Pleura: There is extensive bilateral asymmetric diffuse ground-glass pulmonary infiltrate and peripheral areas of consolidation in keeping with changes of atypical infection in the acute setting and typical of that seen with COVID-19 pneumonia. There is moderate to severe parenchymal involvement. No pneumothorax or pleural effusion. No central obstructing lesion. Upper Abdomen: No acute abnormality. Musculoskeletal: No chest wall abnormality. No acute or significant osseous findings. Review of the MIP images confirms the above findings. IMPRESSION: No pulmonary embolism. Asymmetric bilateral pulmonary infiltrate infiltrates in keeping with atypical infection and typical of that seen with acute COVID-19 pneumonia. Moderate to severe parenchymal involvement. Mild coronary artery calcification. Aortic Atherosclerosis (ICD10-I70.0). Electronically Signed   By: Helyn Numbers MD   On: 03/05/2020 06:57   DG Chest Port 1 View  Result Date: 03/04/2020 CLINICAL DATA:  Shortness of breath for 3 days. History of COVID-19 exposure. EXAM: PORTABLE CHEST 1 VIEW COMPARISON:  None. FINDINGS: The patient has bilateral airspace disease with a peripheral predominance. Heart size is normal. No pneumothorax or  pleural effusion. IMPRESSION: Bilateral pneumonia with an appearance compatible with COVID-19 infection. Electronically Signed   By: Drusilla Kannerhomas  Dalessio M.D.   On: 03/04/2020 20:32

## 2020-03-05 NOTE — ED Notes (Signed)
Pt resting in prone position on non rebreather with O2 sats @ 100%. Pt states he feels "better than I did". Denies any pain or comfort needs from this nurse.

## 2020-03-05 NOTE — ED Notes (Signed)
Breakfast Ordered 

## 2020-03-05 NOTE — H&P (Signed)
History and Physical    Adam Edwards GGE:[id number] DOB: 1945-12-15 DOA: 03/04/2020  PCP: Dennard Schaumann, MD  Patient coming from: Home.  Chief Complaint: Shortness of breath.  HPI: Adam Edwards is a 74 y.o. male with history of hypertension presents to the ER because of worsening shortness of breath for the last 3 days.  Patient states his wife was diagnosed with COVID-19 infection last week.  Patient denies any chest pain has been having some nonproductive cough and shortness of breath even at rest.  Has not been vaccinated against COVID-19 infection.  ED Course: In the ER patient was appearing mildly short of breath requiring high flow oxygen 15 L to maintain sats.  Chest x-ray shows bilateral infiltrates.  Labs are significant for sodium of 127 potassium 3.3 CRP of 11.3 D-dimer of 1.9.  Patient is being admitted for acute respiratory failure with hypoxia secondary to COVID-19 infection.  Started on IV Solu-Medrol and remdesivir.  Review of Systems: As per HPI, rest all negative.   Past Medical History:  Diagnosis Date  . Hypertension     History reviewed. No pertinent surgical history.   reports that he has never smoked. He has never used smokeless tobacco. He reports that he does not drink alcohol. No history on file for drug use.  Not on File  Family History  Problem Relation Age of Onset  . Diabetes Mellitus II Neg Hx     Prior to Admission medications   Not on File    Physical Exam: Constitutional: Moderately built and nourished. Vitals:   03/05/20 0130 03/05/20 0200 03/05/20 0215 03/05/20 0230  BP: (!) 146/71 140/70 136/73 123/66  Pulse: 75 68 77 62  Resp: (!) 22 (!) 34 (!) 24 (!) 26  Temp:      TempSrc:      SpO2: 92% 94% 95% 97%  Weight:      Height:       Eyes: Anicteric no pallor. ENMT: No discharge from the ears eyes nose or mouth. Neck: No mass felt.  No neck rigidity. Respiratory: No rhonchi or crepitations. Cardiovascular: S1-S2  heard. Abdomen: Soft nontender bowel sounds present. Musculoskeletal: No edema. Skin: No rash. Neurologic: Alert awake oriented to time place and person.  Moves all extremities. Psychiatric: Appears normal.  Normal affect.   Labs on Admission: I have personally reviewed following labs and imaging studies  CBC: Recent Labs  Lab 03/04/20 1933  WBC 5.1  NEUTROABS 4.0  HGB 16.3  HCT 47.5  MCV 90.3  PLT 161   Basic Metabolic Panel: Recent Labs  Lab 03/04/20 1933  NA 127*  K 3.3*  CL 90*  CO2 22  GLUCOSE 99  BUN 9  CREATININE 1.12  CALCIUM 9.1   GFR: Estimated Creatinine Clearance: 62.5 mL/min (by C-G formula based on SCr of 1.12 mg/dL). Liver Function Tests: Recent Labs  Lab 03/04/20 1933  AST 69*  ALT 33  ALKPHOS 102  BILITOT 1.0  PROT 6.9  ALBUMIN 3.2*   No results for input(s): LIPASE, AMYLASE in the last 168 hours. No results for input(s): AMMONIA in the last 168 hours. Coagulation Profile: No results for input(s): INR, PROTIME in the last 168 hours. Cardiac Enzymes: No results for input(s): CKTOTAL, CKMB, CKMBINDEX, TROPONINI in the last 168 hours. BNP (last 3 results) No results for input(s): PROBNP in the last 8760 hours. HbA1C: No results for input(s): HGBA1C in the last 72 hours. CBG: No results for input(s): GLUCAP in the last 168 hours.  Lipid Profile: Recent Labs    03/04/20 1933  TRIG 110   Thyroid Function Tests: No results for input(s): TSH, T4TOTAL, FREET4, T3FREE, THYROIDAB in the last 72 hours. Anemia Panel: Recent Labs    03/04/20 1933  FERRITIN 1,204*   Urine analysis: No results found for: COLORURINE, APPEARANCEUR, LABSPEC, PHURINE, GLUCOSEU, HGBUR, BILIRUBINUR, KETONESUR, PROTEINUR, UROBILINOGEN, NITRITE, LEUKOCYTESUR Sepsis Labs: @LABRCNTIP (procalcitonin:4,lacticidven:4) ) Recent Results (from the past 240 hour(s))  Resp Panel by RT-PCR (Flu A&B, Covid) Nasopharyngeal Swab     Status: Abnormal   Collection Time: 03/04/20   7:33 PM   Specimen: Nasopharyngeal Swab; Nasopharyngeal(NP) swabs in vial transport medium  Result Value Ref Range Status   SARS Coronavirus 2 by RT PCR POSITIVE (A) NEGATIVE Final    Comment: RESULT CALLED TO, READ BACK BY AND VERIFIED WITH: [place] 14/02/21 RN 03/04/20 AT 2153 SK (NOTE) SARS-CoV-2 target nucleic acids are DETECTED.  The SARS-CoV-2 RNA is generally detectable in upper respiratory specimens during the acute phase of infection. Positive results are indicative of the presence of the identified virus, but do not rule out bacterial infection or co-infection with other pathogens not detected by the test. Clinical correlation with patient history and other diagnostic information is necessary to determine patient infection status. The expected result is Negative.  Fact Sheet for Patients: 2154  Fact Sheet for Healthcare Providers: BloggerCourse.com  This test is not yet approved or cleared by the [employer] FDA and  has been authorized for detection and/or diagnosis of SARS-CoV-2 by FDA under an Emergency Use Authorization (EUA).  This EUA will remain in effect (meaning this te st can be used) for the duration of  the COVID-19 declaration under Section 564(b)(1) of the Act, 21 U.S.C. section 360bbb-3(b)(1), unless the authorization is terminated or revoked sooner.     Influenza A by PCR NEGATIVE NEGATIVE Final   Influenza B by PCR NEGATIVE NEGATIVE Final    Comment: (NOTE) The Xpert Xpress SARS-CoV-2/FLU/RSV plus assay is intended as an aid in the diagnosis of influenza from Nasopharyngeal swab specimens and should not be used as a sole basis for treatment. Nasal washings and aspirates are unacceptable for Xpert Xpress SARS-CoV-2/FLU/RSV testing.  Fact Sheet for Patients: [place]  Fact Sheet for Healthcare  Providers: BloggerCourse.com  This test is not yet approved or cleared by the [employer] FDA and has been authorized for detection and/or diagnosis of SARS-CoV-2 by FDA under an Emergency Use Authorization (EUA). This EUA will remain in effect (meaning this test can be used) for the duration of the COVID-19 declaration under Section 564(b)(1) of the Act, 21 U.S.C. section 360bbb-3(b)(1), unless the authorization is terminated or revoked.  Performed at [facility], [address] [place], [place]      Radiological Exams on Admission: DG Chest Port 1 View  Result Date: 03/04/2020 CLINICAL DATA:  Shortness of breath for 3 days. History of COVID-19 exposure. EXAM: PORTABLE CHEST 1 VIEW COMPARISON:  None. FINDINGS: The patient has bilateral airspace disease with a peripheral predominance. Heart size is normal. No pneumothorax or pleural effusion. IMPRESSION: Bilateral pneumonia with an appearance compatible with COVID-19 infection. Electronically Signed   By: 14/05/2019 M.D.   On: 03/04/2020 20:32     Assessment/Plan Principal Problem:   COVID-19 Active Problems:   Acute respiratory failure (HCC)   Essential hypertension    1. Acute respiratory failure with hypoxia presently on high flow oxygen 15 L to maintain sats.  Secondary to COVID-19 infection  for which patient has been started on IV Solu-Medrol and remdesivir.  I discussed with patient about using Actemra after discussing about its side effects and contraindication patient has consented.  I have ordered Actemra.  Follow patient's respiratory status and inflammatory markers.  CT angiogram of the chest is pending. 2. Hyponatremia and hypokalemia could be from diuretic use.  Replace and recheck.  Follow metabolic panel. 3. Hypertension Home medication is not clear.  But patient states he probably uses diuretic.  For now I kept patient as needed IV hydralazine.  Since patient has  acute respiratory failure with hypoxia secondary to COVID-19 infection will need inpatient status.   DVT prophylaxis: Lovenox. Code Status: Full code. Family Communication: Discussed with patient. Disposition Plan: Home. Consults called: None. Admission status: Inpatient.   Eduard Clos MD [facility] Pager [phone].  If 7PM-7AM, please contact night-coverage [email or web address] Password [email or web address]  03/05/2020, 2:57 AM

## 2020-03-05 NOTE — ED Notes (Signed)
Pt educated on self proning. Pt able to prone with non rebreather mask on. O2 is 99%.

## 2020-03-06 ENCOUNTER — Inpatient Hospital Stay (HOSPITAL_COMMUNITY): Payer: No Typology Code available for payment source

## 2020-03-06 LAB — COMPREHENSIVE METABOLIC PANEL
ALT: 40 U/L (ref 0–44)
AST: 87 U/L — ABNORMAL HIGH (ref 15–41)
Albumin: 2.6 g/dL — ABNORMAL LOW (ref 3.5–5.0)
Alkaline Phosphatase: 93 U/L (ref 38–126)
Anion gap: 14 (ref 5–15)
BUN: 16 mg/dL (ref 8–23)
CO2: 22 mmol/L (ref 22–32)
Calcium: 9.1 mg/dL (ref 8.9–10.3)
Chloride: 96 mmol/L — ABNORMAL LOW (ref 98–111)
Creatinine, Ser: 1.13 mg/dL (ref 0.61–1.24)
GFR, Estimated: >60 mL/min (ref >60)
Glucose, Bld: 174 mg/dL — ABNORMAL HIGH (ref 70–99)
Potassium: 3.4 mmol/L — ABNORMAL LOW (ref 3.5–5.1)
Sodium: 132 mmol/L — ABNORMAL LOW (ref 135–145)
Total Bilirubin: 1 mg/dL (ref 0.3–1.2)
Total Protein: 5.8 g/dL — ABNORMAL LOW (ref 6.5–8.1)

## 2020-03-06 LAB — C-REACTIVE PROTEIN: CRP: 9.6 mg/dL — ABNORMAL HIGH (ref <1.0)

## 2020-03-06 LAB — MRSA PCR SCREENING: MRSA by PCR: NEGATIVE

## 2020-03-06 LAB — CBC
HCT: 42.6 % (ref 39.0–52.0)
Hemoglobin: 15.4 g/dL (ref 13.0–17.0)
MCH: 31 pg (ref 26.0–34.0)
MCHC: 36.2 g/dL — ABNORMAL HIGH (ref 30.0–36.0)
MCV: 85.7 fL (ref 80.0–100.0)
Platelets: 194 10*3/uL (ref 150–400)
RBC: 4.97 MIL/uL (ref 4.22–5.81)
RDW: 11.5 % (ref 11.5–15.5)
WBC: 6.8 10*3/uL (ref 4.0–10.5)
nRBC: 0 % (ref 0.0–0.2)

## 2020-03-06 LAB — D-DIMER, QUANTITATIVE: D-Dimer, Quant: 3.18 ug/mL-FEU — ABNORMAL HIGH (ref 0.00–0.50)

## 2020-03-06 LAB — BRAIN NATRIURETIC PEPTIDE: B Natriuretic Peptide: 93.1 pg/mL (ref 0.0–100.0)

## 2020-03-06 MED ORDER — ENOXAPARIN SODIUM 60 MG/0.6ML ~~LOC~~ SOLN
0.5000 mg/kg | Freq: Two times a day (BID) | SUBCUTANEOUS | Status: DC
  Administered 2020-03-06 – 2020-03-09 (×8): 50 mg via SUBCUTANEOUS
  Filled 2020-03-06: qty 0.6
  Filled 2020-03-06: qty 0.5
  Filled 2020-03-06 (×2): qty 0.6
  Filled 2020-03-06: qty 0.5
  Filled 2020-03-06 (×4): qty 0.6

## 2020-03-06 MED ORDER — PNEUMOCOCCAL VAC POLYVALENT 25 MCG/0.5ML IJ INJ
0.5000 mL | INJECTION | INTRAMUSCULAR | Status: AC
  Administered 2020-03-07: 0.5 mL via INTRAMUSCULAR
  Filled 2020-03-06: qty 0.5

## 2020-03-06 MED ORDER — AMLODIPINE BESYLATE 10 MG PO TABS
10.0000 mg | ORAL_TABLET | Freq: Every day | ORAL | Status: DC
  Administered 2020-03-06 – 2020-03-10 (×5): 10 mg via ORAL
  Filled 2020-03-06 (×4): qty 1
  Filled 2020-03-06: qty 2

## 2020-03-06 MED ORDER — LEVOTHYROXINE SODIUM 100 MCG PO TABS
100.0000 ug | ORAL_TABLET | Freq: Every day | ORAL | Status: DC
  Administered 2020-03-06 – 2020-03-10 (×5): 100 ug via ORAL
  Filled 2020-03-06 (×5): qty 1

## 2020-03-06 MED ORDER — METHYLPREDNISOLONE SODIUM SUCC 125 MG IJ SOLR
80.0000 mg | Freq: Two times a day (BID) | INTRAMUSCULAR | Status: DC
Start: 2020-03-06 — End: 2020-03-06

## 2020-03-06 MED ORDER — METHYLPREDNISOLONE SODIUM SUCC 125 MG IJ SOLR
60.0000 mg | Freq: Two times a day (BID) | INTRAMUSCULAR | Status: DC
  Administered 2020-03-06 – 2020-03-07 (×4): 60 mg via INTRAVENOUS
  Filled 2020-03-06 (×4): qty 2

## 2020-03-06 MED ORDER — SODIUM CHLORIDE 0.9 % IV SOLN: INTRAVENOUS | Status: AC

## 2020-03-06 MED ORDER — FLUOXETINE HCL 20 MG PO CAPS
20.0000 mg | ORAL_CAPSULE | Freq: Every day | ORAL | Status: DC
  Administered 2020-03-06 – 2020-03-09 (×4): 20 mg via ORAL
  Filled 2020-03-06 (×5): qty 1

## 2020-03-06 MED ORDER — ATORVASTATIN CALCIUM 40 MG PO TABS
40.0000 mg | ORAL_TABLET | Freq: Every day | ORAL | Status: DC
  Administered 2020-03-06 – 2020-03-10 (×4): 40 mg via ORAL
  Filled 2020-03-06 (×5): qty 1

## 2020-03-06 NOTE — ED Notes (Signed)
Noted pt to be 88% on 3lpm via [place]. Placed back on NRB at 15lpm with O2 sat at 100%

## 2020-03-06 NOTE — Progress Notes (Signed)
Arek Spadafore [id number]  Code Status: FULL Admission Data: 03/06/2020 5:59 PM  Attending Provider: Jaking Thayer is a 74 y.o. male patient admitted from ED awake, alert - oriented X 4 - no acute distress noted. VSS - Blood pressure 122/73, pulse 79, temperature 98.2 F (36.8 C), temperature source Oral, resp. rate 20, height 5\' 3"  (1.6 m), weight 99.8 kg, SpO2 100 %. on NRB. No c/o chest pain. Cardiac tele # [id number], in place.  Admission INP armband ID verified with patient/family, and in place.  Fall assessment complete, with patient and family able to verbalize understanding of risk associated with falls, and verbalized understanding to call nsg before up out of bed. Call light within reach, patient able to voice, and demonstrate understanding. Skin, clean-dry- intact without evidence of bruising, or skin tears.  No evidence of skin break down noted on exam.  ?  Will cont to eval and treat per MD orders.  , RN

## 2020-03-06 NOTE — ED Notes (Signed)
Please call daughter Turner Daniels [phone]--advised it has been more than 24 hours since they have had an update please call asap.

## 2020-03-06 NOTE — Progress Notes (Signed)
PROGRESS NOTE                                                                                                                                                                                                             Patient Demographics:    Adam Edwards, is a 74 y.o. male, DOB - [birth year], JKK:[id number]  Outpatient Primary MD for the patient is Dennard Schaumann, MD    LOS - 2  Admit date - 03/04/2020    Chief Complaint  Patient presents with  . Covid Exposure  . Shortness of Breath       Brief Narrative (HPI from H&P)  - Adam Edwards is a 74 y.o. male with history of hypertension presents to the ER because of worsening shortness of breath for the last 3 days prior to hospital visit, in the ER he was diagnosed with severe acute hypoxic respiratory failure due to COVID-19 pneumonia and admitted to the hospital.   Subjective:    Adam Edwards today has, No headache, No chest pain, No abdominal pain - No Nausea, No new weakness tingling or numbness, +ve SOB.   Assessment  & Plan :     1. Acute Hypoxic Resp. Failure due to Acute Covid 19 Viral Pneumonitis during the ongoing 2020 Covid 19 Pandemic - he is unfortunately unvaccinated and has incurred severe parenchymal lung injury due to COVID-19 pneumonia, he has been treated with IV steroids, Remdesivir and Actemra combination.  Overall tenuous.  Will monitor closely.  Encouraged the patient to sit up in chair in the daytime use I-S and flutter valve for pulmonary toiletry and then prone in bed when at night.  Will advance activity and titrate down oxygen as possible.  Actemra/Baricitinib  off label use - patient was told that if COVID-19 pneumonitis gets worse we might potentially use Actemra off label, patient denies any known history of active diverticulitis, tuberculosis or hepatitis, understands the risks and benefits and wants to proceed with Actemra treatment if  required.     SpO2: 91 % O2 Flow Rate (L/min): 15 L/min  Recent Labs  Lab 03/04/20 1933 03/04/20 2103 03/05/20 0106 03/05/20 0401 03/06/20 0227 03/06/20 0229  WBC 5.1  --   --  4.4 6.8  --   HGB 16.3  --   --  14.8 15.4  --   HCT  47.5  --   --  40.0 42.6  --   PLT 161  --   --  174 194  --   CRP 11.3*  --   --  11.1* 9.6*  --   BNP  --   --   --   --   --  93.1  DDIMER 1.92*  --   --  2.00* 3.18*  --   PROCALCITON 0.19  --   --  0.19  --   --   AST 69*  --   --  59* 87*  --   ALT 33  --   --  30 40  --   ALKPHOS 102  --   --  88 93  --   BILITOT 1.0  --   --  0.9 1.0  --   ALBUMIN 3.2*  --   --  2.7* 2.6*  --   LATICACIDVEN  --  1.7 1.7  --   --   --   SARSCOV2NAA POSITIVE*  --   --   --   --   --     2.  Essential hypertension.  Currently stable on Norvasc monitor and adjust.  3.  Dyslipidemia.  On statin.  4.  GERD.  On PPI.      Condition - Extremely Guarded  Family Communication  : Updated daughter Toniann Fail [phone] on 03/06/20  Code Status :  Full  Consults  :  None  Procedures  :   CT -  No pulmonary embolism. Asymmetric bilateral pulmonary infiltrate infiltrates in keeping with atypical infection and typical of that seen with acute COVID-19 pneumonia. Moderate to severe parenchymal involvement. Mild coronary artery calcification. Aortic Atherosclerosis (ICD10-I70.0).    PUD Prophylaxis : PPI  Disposition Plan  :    Status is: Inpatient  Remains inpatient appropriate because:IV treatments appropriate due to intensity of illness or inability to take PO   Dispo: The patient is from: Home              Anticipated d/c is to: Home              Anticipated d/c date is: > 3 days              Patient currently is not medically stable to d/c.   DVT Prophylaxis  :  Lovenox    Lab Results  Component Value Date   PLT 194 03/06/2020    Diet :  Diet Order            Diet Heart Room service appropriate? Yes; Fluid consistency: Thin  Diet  effective now                  Inpatient Medications  Scheduled Meds: . amLODipine  10 mg Oral Daily  . vitamin C  500 mg Oral Daily  . atorvastatin  40 mg Oral QHS  . enoxaparin (LOVENOX) injection  0.5 mg/kg Subcutaneous BID  . FLUoxetine  20 mg Oral Daily  . levothyroxine  100 mcg Oral QAC breakfast  . methylPREDNISolone (SOLU-MEDROL) injection  60 mg Intravenous BID  . pantoprazole  40 mg Oral Q1200  . zinc sulfate  220 mg Oral Daily   Continuous Infusions: . sodium chloride 75 mL/hr at 03/06/20 1117  . remdesivir 100 mg in NS 100 mL Stopped (03/05/20 1021)   PRN Meds:.chlorpheniramine-HYDROcodone, guaiFENesin-dextromethorphan, hydrALAZINE  Antibiotics  :    Anti-infectives (From admission, onward)   Start  Dose/Rate Route Frequency Ordered Stop   03/05/20 1000  remdesivir 100 mg in sodium chloride 0.9 % 100 mL IVPB       "Followed by" Linked Group Details   100 mg 200 mL/hr over 30 Minutes Intravenous Daily 03/04/20 2305 03/09/20 0959   03/04/20 2315  remdesivir 200 mg in sodium chloride 0.9% 250 mL IVPB       "Followed by" Linked Group Details   200 mg 580 mL/hr over 30 Minutes Intravenous Once 03/04/20 2305 03/05/20 0200       Time Spent in minutes  30   Susa Raring M.D on 03/06/2020 at 11:27 AM  To page go to [email or web address] - password [facility]  [facility] -  Office  [phone]   See all Orders from today for further details    Objective:   Vitals:   03/06/20 0930 03/06/20 1015 03/06/20 1100 03/06/20 1121  BP: 117/66  133/79 133/79  Pulse: 82 91 93   Resp: (!) 22 (!) 24 (!) 28   Temp:      TempSrc:      SpO2: 100% 96% 91%   Weight:      Height:        Wt Readings from Last 3 Encounters:  03/04/20 99.8 kg     Intake/Output Summary (Last 24 hours) at 03/06/2020 1127 Last data filed at 03/06/2020 1011 Gross per 24 hour  Intake 1215.15 ml  Output 600 ml  Net 615.15 ml     Physical Exam  Awake Alert, No new F.N deficits,  Normal affect [place].AT,PERRAL Supple Neck,No JVD, No cervical lymphadenopathy appriciated.  Symmetrical Chest wall movement, Good air movement bilaterally, CTAB RRR,No Gallops,Rubs or new Murmurs, No Parasternal Heave +ve B.Sounds, Abd Soft, No tenderness, No organomegaly appriciated, No rebound - guarding or rigidity. No Cyanosis, Clubbing or edema, No new Rash or bruise       Data Review:    CBC Recent Labs  Lab 03/04/20 1933 03/05/20 0401 03/06/20 0227  WBC 5.1 4.4 6.8  HGB 16.3 14.8 15.4  HCT 47.5 40.0 42.6  PLT 161 174 194  MCV 90.3 86.6 85.7  MCH 31.0 32.0 31.0  MCHC 34.3 37.0* 36.2*  RDW 11.4* 11.5 11.5  LYMPHSABS 0.7 0.6*  --   MONOABS 0.3 0.4  --   EOSABS 0.0 0.0  --   BASOSABS 0.1 0.0  --     Recent Labs  Lab 03/04/20 1933 03/04/20 2103 03/05/20 0106 03/05/20 0401 03/06/20 0227 03/06/20 0229  NA 127*  --   --  127* 132*  --   K 3.3*  --   --  3.3* 3.4*  --   CL 90*  --   --  91* 96*  --   CO2 22  --   --  22 22  --   GLUCOSE 99  --   --  136* 174*  --   BUN 9  --   --  11 16  --   CREATININE 1.12  --   --  1.09 1.13  --   CALCIUM 9.1  --   --  8.5* 9.1  --   AST 69*  --   --  59* 87*  --   ALT 33  --   --  30 40  --   ALKPHOS 102  --   --  88 93  --   BILITOT 1.0  --   --  0.9 1.0  --   ALBUMIN 3.2*  --   --  2.7* 2.6*  --   CRP 11.3*  --   --  11.1* 9.6*  --   DDIMER 1.92*  --   --  2.00* 3.18*  --   PROCALCITON 0.19  --   --  0.19  --   --   LATICACIDVEN  --  1.7 1.7  --   --   --   BNP  --   --   --   --   --  93.1    ------------------------------------------------------------------------------------------------------------------ Recent Labs    03/04/20 1933  TRIG 110    No results found for: HGBA1C ------------------------------------------------------------------------------------------------------------------ No results for input(s): TSH, T4TOTAL, T3FREE, THYROIDAB in the last 72 hours.  Invalid input(s): FREET3  Cardiac  Enzymes No results for input(s): CKMB, TROPONINI, MYOGLOBIN in the last 168 hours.  Invalid input(s): CK ------------------------------------------------------------------------------------------------------------------    Component Value Date/Time   BNP 93.1 03/06/2020 0229    Micro Results Recent Results (from the past 240 hour(s))  Resp Panel by RT-PCR (Flu A&B, Covid) Nasopharyngeal Swab     Status: Abnormal   Collection Time: 03/04/20  7:33 PM   Specimen: Nasopharyngeal Swab; Nasopharyngeal(NP) swabs in vial transport medium  Result Value Ref Range Status   SARS Coronavirus 2 by RT PCR POSITIVE (A) NEGATIVE Final    Comment: RESULT CALLED TO, READ BACK BY AND VERIFIED WITH: Andree Elk RN 03/04/20 AT 2153 SK (NOTE) SARS-CoV-2 target nucleic acids are DETECTED.  The SARS-CoV-2 RNA is generally detectable in upper respiratory specimens during the acute phase of infection. Positive results are indicative of the presence of the identified virus, but do not rule out bacterial infection or co-infection with other pathogens not detected by the test. Clinical correlation with patient history and other diagnostic information is necessary to determine patient infection status. The expected result is Negative.  Fact Sheet for Patients: BloggerCourse.com  Fact Sheet for Healthcare Providers: SeriousBroker.it  This test is not yet approved or cleared by the [place] FDA and  has been authorized for detection and/or diagnosis of SARS-CoV-2 by FDA under an Emergency Use Authorization (EUA).  This EUA will remain in effect (meaning this te st can be used) for the duration of  the COVID-19 declaration under Section 564(b)(1) of the Act, 21 U.S.C. section 360bbb-3(b)(1), unless the authorization is terminated or revoked sooner.     Influenza A by PCR NEGATIVE NEGATIVE Final   Influenza B by PCR NEGATIVE NEGATIVE Final     Comment: (NOTE) The Xpert Xpress SARS-CoV-2/FLU/RSV plus assay is intended as an aid in the diagnosis of influenza from Nasopharyngeal swab specimens and should not be used as a sole basis for treatment. Nasal washings and aspirates are unacceptable for Xpert Xpress SARS-CoV-2/FLU/RSV testing.  Fact Sheet for Patients: BloggerCourse.com  Fact Sheet for Healthcare Providers: SeriousBroker.it  This test is not yet approved or cleared by the [place] FDA and has been authorized for detection and/or diagnosis of SARS-CoV-2 by FDA under an Emergency Use Authorization (EUA). This EUA will remain in effect (meaning this test can be used) for the duration of the COVID-19 declaration under Section 564(b)(1) of the Act, 21 U.S.C. section 360bbb-3(b)(1), unless the authorization is terminated or revoked.  Performed at [facility], [address], [place], [place] [address]   Blood Culture (routine x 2)     Status: None (Preliminary result)   Collection Time: 03/04/20  8:45 PM   Specimen: BLOOD LEFT HAND  Result Value Ref Range Status  Specimen Description BLOOD LEFT HAND  Final   Special Requests   Final    AEROBIC BOTTLE ONLY Blood Culture results may not be optimal due to an inadequate volume of blood received in culture bottles   Culture   Final    NO GROWTH 2 DAYS Performed at [facility], [address], [place], [place] [address]    Report Status PENDING  Incomplete  Blood Culture (routine x 2)     Status: None (Preliminary result)   Collection Time: 03/05/20 12:16 AM   Specimen: BLOOD LEFT FOREARM  Result Value Ref Range Status   Specimen Description BLOOD LEFT FOREARM  Final   Special Requests   Final    BOTTLES DRAWN AEROBIC AND ANAEROBIC Blood Culture adequate volume   Culture   Final    NO GROWTH 1 DAY Performed at [facility], [address] [place], [place] [address]    Report Status PENDING   Incomplete    Radiology Reports CT ANGIO CHEST PE W OR WO CONTRAST  Result Date: 03/05/2020 CLINICAL DATA:  Pulmonary embolism, dyspnea, COVID pneumonia EXAM: CT ANGIOGRAPHY CHEST WITH CONTRAST TECHNIQUE: Multidetector CT imaging of the chest was performed using the standard protocol during bolus administration of intravenous contrast. Multiplanar CT image reconstructions and MIPs were obtained to evaluate the vascular anatomy. CONTRAST:  60mL OMNIPAQUE IOHEXOL 350 MG/ML SOLN COMPARISON:  None. FINDINGS: Cardiovascular: There is adequate opacification of the pulmonary arterial tree. No intraluminal filling defect identified to suggest acute pulmonary embolism. The pulmonary arteries are of normal caliber. Mild coronary artery calcification. Global cardiac size within normal limits. No pericardial effusion. Thoracic aorta demonstrates mild atherosclerotic calcification within the arch. No aortic aneurysm. Mediastinum/Nodes: No enlarged mediastinal, hilar, or axillary lymph nodes. Thyroid gland, trachea, and esophagus demonstrate no significant findings. Small hiatal hernia noted. Lungs/Pleura: There is extensive bilateral asymmetric diffuse ground-glass pulmonary infiltrate and peripheral areas of consolidation in keeping with changes of atypical infection in the acute setting and typical of that seen with COVID-19 pneumonia. There is moderate to severe parenchymal involvement. No pneumothorax or pleural effusion. No central obstructing lesion. Upper Abdomen: No acute abnormality. Musculoskeletal: No chest wall abnormality. No acute or significant osseous findings. Review of the MIP images confirms the above findings. IMPRESSION: No pulmonary embolism. Asymmetric bilateral pulmonary infiltrate infiltrates in keeping with atypical infection and typical of that seen with acute COVID-19 pneumonia. Moderate to severe parenchymal involvement. Mild coronary artery calcification. Aortic Atherosclerosis (ICD10-I70.0).  Electronically Signed   By: Helyn Numbers MD   On: 03/05/2020 06:57   DG Chest Port 1 View  Result Date: 03/06/2020 CLINICAL DATA:  Shortness of breath.  COVID-19 positive. EXAM: PORTABLE CHEST 1 VIEW COMPARISON:  03/04/2020 and chest CT 03/05/2020 FINDINGS: Parenchymal densities in the right upper lung have progressed since 03/04/2020. Findings are similar to the recent CT findings. Persistent patchy densities in the mid and lower left lung. Heart size is grossly stable. Negative for pneumothorax. IMPRESSION: Bilateral parenchymal densities are compatible with bilateral pneumonia. Progression of disease since 03/04/2020. The distribution of disease is compatible with COVID-19 infection. Electronically Signed   By: Richarda Overlie M.D.   On: 03/06/2020 08:52   DG Chest Port 1 View  Result Date: 03/04/2020 CLINICAL DATA:  Shortness of breath for 3 days. History of COVID-19 exposure. EXAM: PORTABLE CHEST 1 VIEW COMPARISON:  None. FINDINGS: The patient has bilateral airspace disease with a peripheral predominance. Heart size is normal. No pneumothorax or pleural  effusion. IMPRESSION: Bilateral pneumonia with an appearance compatible with COVID-19 infection. Electronically Signed   By: Drusilla Kannerhomas  Dalessio M.D.   On: 03/04/2020 20:32   VAS US LOWER EXTREMITY VENOUS (DVT)  Result Date: 03/05/2020  Lower Venous DVT Study Indications: Elevated Ddimer.  Risk Factors: COVID 19 positive. Comparison Study: No prior studies. Performing Technologist: Chanda BusingGregory Collins RVT  Examination Guidelines: A complete evaluation includes B-mode imaging, spectral Doppler, color Doppler, and power Doppler as needed of all accessible portions of each vessel. Bilateral testing is considered an integral part of a complete examination. Limited examinations for reoccurring indications may be performed as noted. The reflux portion of the exam is performed with the patient in reverse Trendelenburg.   +---------+---------------+---------+-----------+----------+--------------+ RIGHT    CompressibilityPhasicitySpontaneityPropertiesThrombus Aging +---------+---------------+---------+-----------+----------+--------------+ CFV      Full           Yes      Yes                                 +---------+---------------+---------+-----------+----------+--------------+ SFJ      Full                                                        +---------+---------------+---------+-----------+----------+--------------+ FV Prox  Full                                                        +---------+---------------+---------+-----------+----------+--------------+ FV Mid   Full                                                        +---------+---------------+---------+-----------+----------+--------------+ FV DistalFull                                                        +---------+---------------+---------+-----------+----------+--------------+ PFV      Full                                                        +---------+---------------+---------+-----------+----------+--------------+ POP      Full           Yes      Yes                                 +---------+---------------+---------+-----------+----------+--------------+ PTV      Full                                                        +---------+---------------+---------+-----------+----------+--------------+  PERO     Full                                                        +---------+---------------+---------+-----------+----------+--------------+   +---------+---------------+---------+-----------+----------+--------------+ LEFT     CompressibilityPhasicitySpontaneityPropertiesThrombus Aging +---------+---------------+---------+-----------+----------+--------------+ CFV      Full           Yes      Yes                                  +---------+---------------+---------+-----------+----------+--------------+ SFJ      Full                                                        +---------+---------------+---------+-----------+----------+--------------+ FV Prox  Full                                                        +---------+---------------+---------+-----------+----------+--------------+ FV Mid   Full                                                        +---------+---------------+---------+-----------+----------+--------------+ FV DistalFull                                                        +---------+---------------+---------+-----------+----------+--------------+ PFV      Full                                                        +---------+---------------+---------+-----------+----------+--------------+ POP      Full           Yes      Yes                                 +---------+---------------+---------+-----------+----------+--------------+ PTV      Full                                                        +---------+---------------+---------+-----------+----------+--------------+ PERO     Full                                                        +---------+---------------+---------+-----------+----------+--------------+  Summary: RIGHT: - There is no evidence of deep vein thrombosis in the lower extremity.  - No cystic structure found in the popliteal fossa.  LEFT: - There is no evidence of deep vein thrombosis in the lower extremity.  - No cystic structure found in the popliteal fossa.  *See table(s) above for measurements and observations.    Preliminary

## 2020-03-06 NOTE — ED Notes (Signed)
Pt switched to supine position at this time.

## 2020-03-06 NOTE — ED Notes (Signed)
Breakfast Ordered 

## 2020-03-06 NOTE — ED Notes (Signed)
Patient taken by this RN and ED tech to [phone] via stretcher. O2 and monitors on patient. All belongings including watch and phone and charger taken up w patient. Patient understanding reason for admission, alert and oriented x 4. VSS on current support. Report to Serina Cowper, RN and bedside updates given.

## 2020-03-06 NOTE — ED Notes (Signed)
Pt switched to nasal cannula at 3lpm, pt tolerating with O2 sat of 95-96%. Pt made aware if any increase in SOB to use call bell at bedside. Pt placed on moderate high back rest.

## 2020-03-07 DIAGNOSIS — U071 COVID-19: Secondary | ICD-10-CM | POA: Diagnosis not present

## 2020-03-07 LAB — COMPREHENSIVE METABOLIC PANEL
ALT: 40 U/L (ref 0–44)
AST: 71 U/L — ABNORMAL HIGH (ref 15–41)
Albumin: 2.4 g/dL — ABNORMAL LOW (ref 3.5–5.0)
Alkaline Phosphatase: 78 U/L (ref 38–126)
Anion gap: 9 (ref 5–15)
BUN: 18 mg/dL (ref 8–23)
CO2: 25 mmol/L (ref 22–32)
Calcium: 8.5 mg/dL — ABNORMAL LOW (ref 8.9–10.3)
Chloride: 97 mmol/L — ABNORMAL LOW (ref 98–111)
Creatinine, Ser: 0.99 mg/dL (ref 0.61–1.24)
GFR, Estimated: >60 mL/min (ref >60)
Glucose, Bld: 156 mg/dL — ABNORMAL HIGH (ref 70–99)
Potassium: 3.2 mmol/L — ABNORMAL LOW (ref 3.5–5.1)
Sodium: 131 mmol/L — ABNORMAL LOW (ref 135–145)
Total Bilirubin: 0.9 mg/dL (ref 0.3–1.2)
Total Protein: 5.2 g/dL — ABNORMAL LOW (ref 6.5–8.1)

## 2020-03-07 LAB — CBC WITH DIFFERENTIAL/PLATELET
Abs Immature Granulocytes: 0.29 10*3/uL — ABNORMAL HIGH (ref 0.00–0.07)
Basophils Absolute: 0.1 10*3/uL (ref 0.0–0.1)
Basophils Relative: 0 %
Eosinophils Absolute: 0 10*3/uL (ref 0.0–0.5)
Eosinophils Relative: 0 %
HCT: 39.9 % (ref 39.0–52.0)
Hemoglobin: 14.4 g/dL (ref 13.0–17.0)
Immature Granulocytes: 2 %
Lymphocytes Relative: 8 %
Lymphs Abs: 1.3 10*3/uL (ref 0.7–4.0)
MCH: 30.8 pg (ref 26.0–34.0)
MCHC: 36.1 g/dL — ABNORMAL HIGH (ref 30.0–36.0)
MCV: 85.3 fL (ref 80.0–100.0)
Monocytes Absolute: 1.4 10*3/uL — ABNORMAL HIGH (ref 0.1–1.0)
Monocytes Relative: 8 %
Neutro Abs: 14.2 10*3/uL — ABNORMAL HIGH (ref 1.7–7.7)
Neutrophils Relative %: 82 %
Platelets: 265 10*3/uL (ref 150–400)
RBC: 4.68 MIL/uL (ref 4.22–5.81)
RDW: 11.6 % (ref 11.5–15.5)
WBC: 17.1 10*3/uL — ABNORMAL HIGH (ref 4.0–10.5)
nRBC: 0 % (ref 0.0–0.2)

## 2020-03-07 LAB — OSMOLALITY: Osmolality: 286 mOsm/kg (ref 275–295)

## 2020-03-07 LAB — C-REACTIVE PROTEIN: CRP: 3.3 mg/dL — ABNORMAL HIGH (ref <1.0)

## 2020-03-07 LAB — OSMOLALITY, URINE: Osmolality, Ur: 634 mOsm/kg (ref 300–900)

## 2020-03-07 LAB — D-DIMER, QUANTITATIVE: D-Dimer, Quant: 1.28 ug/mL-FEU — ABNORMAL HIGH (ref 0.00–0.50)

## 2020-03-07 LAB — BRAIN NATRIURETIC PEPTIDE: B Natriuretic Peptide: 117.7 pg/mL — ABNORMAL HIGH (ref 0.0–100.0)

## 2020-03-07 LAB — URIC ACID: Uric Acid, Serum: 3.9 mg/dL (ref 3.7–8.6)

## 2020-03-07 LAB — MAGNESIUM: Magnesium: 1.9 mg/dL (ref 1.7–2.4)

## 2020-03-07 LAB — PROCALCITONIN: Procalcitonin: 0.16 ng/mL

## 2020-03-07 LAB — SODIUM, URINE, RANDOM: Sodium, Ur: 14 mmol/L

## 2020-03-07 MED ORDER — POTASSIUM CHLORIDE CRYS ER 20 MEQ PO TBCR
40.0000 meq | EXTENDED_RELEASE_TABLET | Freq: Once | ORAL | Status: DC
Start: 2020-03-07 — End: 2020-03-07

## 2020-03-07 MED ORDER — POTASSIUM CHLORIDE CRYS ER 20 MEQ PO TBCR
40.0000 meq | EXTENDED_RELEASE_TABLET | Freq: Two times a day (BID) | ORAL | Status: AC
  Administered 2020-03-07 (×2): 40 meq via ORAL
  Filled 2020-03-07 (×2): qty 2

## 2020-03-07 MED ORDER — LACTATED RINGERS IV SOLN: INTRAVENOUS | Status: DC

## 2020-03-07 NOTE — Progress Notes (Signed)
PROGRESS NOTE                                                                                                                                                                                                             Patient Demographics:    Adam Edwards, is a 74 y.o. male, DOB - 1945/09/25, WUJ:[id number]  Outpatient Primary MD for the patient is Dennard SchaumannParuchuri, Vamsee, MD    LOS - 3  Admit date - 03/04/2020    Chief Complaint  Patient presents with  . Covid Exposure  . Shortness of Breath       Brief Narrative (HPI from H&P)  - Adam Edwards is a 74 y.o. male with history of hypertension presents to the ER because of worsening shortness of breath for the last 3 days prior to hospital visit, in the ER he was diagnosed with severe acute hypoxic respiratory failure due to COVID-19 pneumonia and admitted to the hospital.   Subjective:   Patient in bed, appears comfortable, denies any headache, no fever, no chest pain or pressure, no shortness of breath , no abdominal pain. No focal weakness.  Assessment  & Plan :     1. Acute Hypoxic Resp. Failure due to Acute Covid 19 Viral Pneumonitis during the ongoing 2020 Covid 19 Pandemic - he is unfortunately unvaccinated and has incurred severe parenchymal lung injury due to COVID-19 pneumonia, he has been treated with IV steroids, Remdesivir and Actemra combination.  Some clinical improvement after Actemra dose down from 15 L nonrebreather to 6 L of oxygen, still overall tenuous we will continue to monitor closely.  Encouraged the patient to sit up in chair in the daytime use I-S and flutter valve for pulmonary toiletry and then prone in bed when at night.  Will advance activity and titrate down oxygen as possible.  SpO2: 94 % O2 Flow Rate (L/min): 6 L/min  Recent Labs  Lab 03/04/20 1933 03/04/20 2103 03/05/20 0106 03/05/20 0401 03/06/20 0227 03/06/20 0229 03/07/20 0442  WBC  5.1  --   --  4.4 6.8  --  17.1*  HGB 16.3  --   --  14.8 15.4  --  14.4  HCT 47.5  --   --  40.0 42.6  --  39.9  PLT 161  --   --  174 194  --  265  CRP 11.3*  --   --  11.1* 9.6*  --  3.3*  BNP  --   --   --   --   --  93.1 117.7*  DDIMER 1.92*  --   --  2.00* 3.18*  --  1.28*  PROCALCITON 0.19  --   --  0.19  --   --  0.16  AST 69*  --   --  59* 87*  --  71*  ALT 33  --   --  30 40  --  40  ALKPHOS 102  --   --  88 93  --  78  BILITOT 1.0  --   --  0.9 1.0  --  0.9  ALBUMIN 3.2*  --   --  2.7* 2.6*  --  2.4*  LATICACIDVEN  --  1.7 1.7  --   --   --   --   SARSCOV2NAA POSITIVE*  --   --   --   --   --   --     2.  Essential hypertension.  Currently stable on Norvasc monitor and adjust.  3.  Dyslipidemia.  On statin.  4.  GERD.  On PPI.  5.  Hypokalemia and hyponatremia.  Hydrate with IV fluids, replace potassium.  Check serum osmolality along with urine electrolytes.  6.  Mildly elevated D-dimer due to intense inflammation.  On moderate dose Lovenox, leg ultrasound negative.  Trend D-dimer.     Condition - Extremely Guarded  Family Communication  : Updated daughter Toniann Fail [phone] on 03/06/20  Code Status :  Full  Consults  :  None  Procedures  :   Leg [place] - No DVT  CT -  No pulmonary embolism. Asymmetric bilateral pulmonary infiltrate infiltrates in keeping with atypical infection and typical of that seen with acute COVID-19 pneumonia. Moderate to severe parenchymal involvement. Mild coronary artery calcification. Aortic Atherosclerosis (ICD10-I70.0).    PUD Prophylaxis : PPI  Disposition Plan  :    Status is: Inpatient  Remains inpatient appropriate because:IV treatments appropriate due to intensity of illness or inability to take PO   Dispo: The patient is from: Home              Anticipated d/c is to: Home              Anticipated d/c date is: > 3 days              Patient currently is not medically stable to d/c.   DVT Prophylaxis  :  Lovenox     Lab Results  Component Value Date   PLT 265 03/07/2020    Diet :  Diet Order            Diet Heart Room service appropriate? Yes; Fluid consistency: Thin  Diet effective now                  Inpatient Medications  Scheduled Meds: . amLODipine  10 mg Oral Daily  . vitamin C  500 mg Oral Daily  . atorvastatin  40 mg Oral QHS  . enoxaparin (LOVENOX) injection  0.5 mg/kg Subcutaneous BID  . FLUoxetine  20 mg Oral Daily  . levothyroxine  100 mcg Oral QAC breakfast  . methylPREDNISolone (SOLU-MEDROL) injection  60 mg Intravenous BID  . pantoprazole  40 mg Oral Q1200  . pneumococcal 23 valent vaccine  0.5 mL Intramuscular Tomorrow-1000  . zinc sulfate  220 mg Oral  Daily   Continuous Infusions: . remdesivir 100 mg in NS 100 mL 100 mg (03/07/20 0814)   PRN Meds:.chlorpheniramine-HYDROcodone, guaiFENesin-dextromethorphan, hydrALAZINE  Antibiotics  :    Anti-infectives (From admission, onward)   Start     Dose/Rate Route Frequency Ordered Stop   03/05/20 1000  remdesivir 100 mg in sodium chloride 0.9 % 100 mL IVPB       "Followed by" Linked Group Details   100 mg 200 mL/hr over 30 Minutes Intravenous Daily 03/04/20 2305 03/09/20 0959   03/04/20 2315  remdesivir 200 mg in sodium chloride 0.9% 250 mL IVPB       "Followed by" Linked Group Details   200 mg 580 mL/hr over 30 Minutes Intravenous Once 03/04/20 2305 03/05/20 0200       Time Spent in minutes  30   Susa Raring M.D on 03/07/2020 at 9:49 AM  To page go to [email or web address] - password [email or web address]  [facility] -  Office  [phone]   See all Orders from today for further details    Objective:   Vitals:   03/06/20 2036 03/06/20 2315 03/07/20 0313 03/07/20 0806  BP: 140/67 124/70 123/68 131/76  Pulse: 88 82 84 84  Resp: 20 19 18 19   Temp: 98.2 F (36.8 C) 98 F (36.7 C) 98.4 F (36.9 C) 98 F (36.7 C)  TempSrc: Oral Oral Oral Oral  SpO2: 98% 92% 97% 94%  Weight:      Height:        Wt Readings  from Last 3 Encounters:  03/04/20 99.8 kg     Intake/Output Summary (Last 24 hours) at 03/07/2020 0949 Last data filed at 03/07/2020 0529 Gross per 24 hour  Intake 1718.23 ml  Output 500 ml  Net 1218.23 ml     Physical Exam  Awake Alert, No new F.N deficits, Normal affect .AT,PERRAL Supple Neck,No JVD, No cervical lymphadenopathy appriciated.  Symmetrical Chest wall movement, Good air movement bilaterally, CTAB RRR,No Gallops, Rubs or new Murmurs, No Parasternal Heave +ve B.Sounds, Abd Soft, No tenderness, No organomegaly appriciated, No rebound - guarding or rigidity. No Cyanosis, Clubbing or edema, No new Rash or bruise       Data Review:    CBC Recent Labs  Lab 03/04/20 1933 03/05/20 0401 03/06/20 0227 03/07/20 0442  WBC 5.1 4.4 6.8 17.1*  HGB 16.3 14.8 15.4 14.4  HCT 47.5 40.0 42.6 39.9  PLT 161 174 194 265  MCV 90.3 86.6 85.7 85.3  MCH 31.0 32.0 31.0 30.8  MCHC 34.3 37.0* 36.2* 36.1*  RDW 11.4* 11.5 11.5 11.6  LYMPHSABS 0.7 0.6*  --  1.3  MONOABS 0.3 0.4  --  1.4*  EOSABS 0.0 0.0  --  0.0  BASOSABS 0.1 0.0  --  0.1    Recent Labs  Lab 03/04/20 1933 03/04/20 2103 03/05/20 0106 03/05/20 0401 03/06/20 0227 03/06/20 0229 03/07/20 0442  NA 127*  --   --  127* 132*  --  131*  K 3.3*  --   --  3.3* 3.4*  --  3.2*  CL 90*  --   --  91* 96*  --  97*  CO2 22  --   --  22 22  --  25  GLUCOSE 99  --   --  136* 174*  --  156*  BUN 9  --   --  11 16  --  18  CREATININE 1.12  --   --  1.09 1.13  --  0.99  CALCIUM 9.1  --   --  8.5* 9.1  --  8.5*  AST 69*  --   --  59* 87*  --  71*  ALT 33  --   --  30 40  --  40  ALKPHOS 102  --   --  88 93  --  78  BILITOT 1.0  --   --  0.9 1.0  --  0.9  ALBUMIN 3.2*  --   --  2.7* 2.6*  --  2.4*  MG  --   --   --   --   --   --  1.9  CRP 11.3*  --   --  11.1* 9.6*  --  3.3*  DDIMER 1.92*  --   --  2.00* 3.18*  --  1.28*  PROCALCITON 0.19  --   --  0.19  --   --  0.16  LATICACIDVEN  --  1.7 1.7  --   --   --   --    BNP  --   --   --   --   --  93.1 117.7*    ------------------------------------------------------------------------------------------------------------------ Recent Labs    03/04/20 1933  TRIG 110    No results found for: HGBA1C ------------------------------------------------------------------------------------------------------------------ No results for input(s): TSH, T4TOTAL, T3FREE, THYROIDAB in the last 72 hours.  Invalid input(s): FREET3  Cardiac Enzymes No results for input(s): CKMB, TROPONINI, MYOGLOBIN in the last 168 hours.  Invalid input(s): CK ------------------------------------------------------------------------------------------------------------------    Component Value Date/Time   BNP 117.7 (H) 03/07/2020 0442    Micro Results Recent Results (from the past 240 hour(s))  Resp Panel by RT-PCR (Flu A&B, Covid) Nasopharyngeal Swab     Status: Abnormal   Collection Time: 03/04/20  7:33 PM   Specimen: Nasopharyngeal Swab; Nasopharyngeal(NP) swabs in vial transport medium  Result Value Ref Range Status   SARS Coronavirus 2 by RT PCR POSITIVE (A) NEGATIVE Final    Comment: RESULT CALLED TO, READ BACK BY AND VERIFIED WITH: Andree Elk RN 03/04/20 AT 2153 SK (NOTE) SARS-CoV-2 target nucleic acids are DETECTED.  The SARS-CoV-2 RNA is generally detectable in upper respiratory specimens during the acute phase of infection. Positive results are indicative of the presence of the identified virus, but do not rule out bacterial infection or co-infection with other pathogens not detected by the test. Clinical correlation with patient history and other diagnostic information is necessary to determine patient infection status. The expected result is Negative.  Fact Sheet for Patients: BloggerCourse.com  Fact Sheet for Healthcare Providers: SeriousBroker.it  This test is not yet approved or cleared by the  [place] FDA and  has been authorized for detection and/or diagnosis of SARS-CoV-2 by FDA under an Emergency Use Authorization (EUA).  This EUA will remain in effect (meaning this te st can be used) for the duration of  the COVID-19 declaration under Section 564(b)(1) of the Act, 21 U.S.C. section 360bbb-3(b)(1), unless the authorization is terminated or revoked sooner.     Influenza A by PCR NEGATIVE NEGATIVE Final   Influenza B by PCR NEGATIVE NEGATIVE Final    Comment: (NOTE) The Xpert Xpress SARS-CoV-2/FLU/RSV plus assay is intended as an aid in the diagnosis of influenza from Nasopharyngeal swab specimens and should not be used as a sole basis for treatment. Nasal washings and aspirates are unacceptable for Xpert Xpress SARS-CoV-2/FLU/RSV testing.  Fact Sheet for Patients: BloggerCourse.com  Fact Sheet for Healthcare Providers: SeriousBroker.it  This test is not  yet approved or cleared by the [place] and has been authorized for detection and/or diagnosis of SARS-CoV-2 by FDA under an Emergency Use Authorization (EUA). This EUA will remain in effect (meaning this test can be used) for the duration of the COVID-19 declaration under Section 564(b)(1) of the Act, 21 U.S.C. section 360bbb-3(b)(1), unless the authorization is terminated or revoked.  Performed at [facility], [address] [place], [place] [address]   Blood Culture (routine x 2)     Status: None (Preliminary result)   Collection Time: 03/04/20  8:45 PM   Specimen: BLOOD LEFT HAND  Result Value Ref Range Status   Specimen Description BLOOD LEFT HAND  Final   Special Requests   Final    AEROBIC BOTTLE ONLY Blood Culture results may not be optimal due to an inadequate volume of blood received in culture bottles   Culture   Final    NO GROWTH 3 DAYS Performed at [facility], [address] [place], [place] [address]    Report  Status PENDING  Incomplete  Blood Culture (routine x 2)     Status: None (Preliminary result)   Collection Time: 03/05/20 12:16 AM   Specimen: BLOOD LEFT FOREARM  Result Value Ref Range Status   Specimen Description BLOOD LEFT FOREARM  Final   Special Requests   Final    BOTTLES DRAWN AEROBIC AND ANAEROBIC Blood Culture adequate volume   Culture   Final    NO GROWTH 2 DAYS Performed at [facility], [address] [place], [place] [address]    Report Status PENDING  Incomplete  MRSA PCR Screening     Status: None   Collection Time: 03/06/20  2:36 PM   Specimen: Nasal Mucosa; Nasopharyngeal  Result Value Ref Range Status   MRSA by PCR NEGATIVE NEGATIVE Final    Comment:        The GeneXpert MRSA Assay (FDA approved for NASAL specimens only), is one component of a comprehensive MRSA colonization surveillance program. It is not intended to diagnose MRSA infection nor to guide or monitor treatment for MRSA infections. Performed at [facility], [address], [place], [place] [address]     Radiology Reports CT ANGIO CHEST PE W OR WO CONTRAST  Result Date: 03/05/2020 CLINICAL DATA:  Pulmonary embolism, dyspnea, COVID pneumonia EXAM: CT ANGIOGRAPHY CHEST WITH CONTRAST TECHNIQUE: Multidetector CT imaging of the chest was performed using the standard protocol during bolus administration of intravenous contrast. Multiplanar CT image reconstructions and MIPs were obtained to evaluate the vascular anatomy. CONTRAST:  60mL OMNIPAQUE IOHEXOL 350 MG/ML SOLN COMPARISON:  None. FINDINGS: Cardiovascular: There is adequate opacification of the pulmonary arterial tree. No intraluminal filling defect identified to suggest acute pulmonary embolism. The pulmonary arteries are of normal caliber. Mild coronary artery calcification. Global cardiac size within normal limits. No pericardial effusion. Thoracic aorta demonstrates mild atherosclerotic calcification within the arch. No aortic  aneurysm. Mediastinum/Nodes: No enlarged mediastinal, hilar, or axillary lymph nodes. Thyroid gland, trachea, and esophagus demonstrate no significant findings. Small hiatal hernia noted. Lungs/Pleura: There is extensive bilateral asymmetric diffuse ground-glass pulmonary infiltrate and peripheral areas of consolidation in keeping with changes of atypical infection in the acute setting and typical of that seen with COVID-19 pneumonia. There is moderate to severe parenchymal involvement. No pneumothorax or pleural effusion. No central obstructing lesion. Upper Abdomen: No acute abnormality. Musculoskeletal: No chest wall abnormality. No acute or significant osseous findings. Review of  the MIP images confirms the above findings. IMPRESSION: No pulmonary embolism. Asymmetric bilateral pulmonary infiltrate infiltrates in keeping with atypical infection and typical of that seen with acute COVID-19 pneumonia. Moderate to severe parenchymal involvement. Mild coronary artery calcification. Aortic Atherosclerosis (ICD10-I70.0). Electronically Signed   By: Helyn Numbers MD   On: 03/05/2020 06:57   DG Chest Port 1 View  Result Date: 03/06/2020 CLINICAL DATA:  Shortness of breath.  COVID-19 positive. EXAM: PORTABLE CHEST 1 VIEW COMPARISON:  03/04/2020 and chest CT 03/05/2020 FINDINGS: Parenchymal densities in the right upper lung have progressed since 03/04/2020. Findings are similar to the recent CT findings. Persistent patchy densities in the mid and lower left lung. Heart size is grossly stable. Negative for pneumothorax. IMPRESSION: Bilateral parenchymal densities are compatible with bilateral pneumonia. Progression of disease since 03/04/2020. The distribution of disease is compatible with COVID-19 infection. Electronically Signed   By: Richarda Overlie M.D.   On: 03/06/2020 08:52   DG Chest Port 1 View  Result Date: 03/04/2020 CLINICAL DATA:  Shortness of breath for 3 days. History of COVID-19 exposure. EXAM: PORTABLE  CHEST 1 VIEW COMPARISON:  None. FINDINGS: The patient has bilateral airspace disease with a peripheral predominance. Heart size is normal. No pneumothorax or pleural effusion. IMPRESSION: Bilateral pneumonia with an appearance compatible with COVID-19 infection. Electronically Signed   By: Drusilla Kanner M.D.   On: 03/04/2020 20:32   VAS [place] LOWER EXTREMITY VENOUS (DVT)  Result Date: 03/06/2020  Lower Venous DVT Study Indications: Elevated Ddimer.  Risk Factors: COVID 19 positive. Comparison Study: No prior studies. Performing Technologist: Chanda Busing RVT  Examination Guidelines: A complete evaluation includes B-mode imaging, spectral Doppler, color Doppler, and power Doppler as needed of all accessible portions of each vessel. Bilateral testing is considered an integral part of a complete examination. Limited examinations for reoccurring indications may be performed as noted. The reflux portion of the exam is performed with the patient in reverse Trendelenburg.  +---------+---------------+---------+-----------+----------+--------------+ RIGHT    CompressibilityPhasicitySpontaneityPropertiesThrombus Aging +---------+---------------+---------+-----------+----------+--------------+ CFV      Full           Yes      Yes                                 +---------+---------------+---------+-----------+----------+--------------+ SFJ      Full                                                        +---------+---------------+---------+-----------+----------+--------------+ FV Prox  Full                                                        +---------+---------------+---------+-----------+----------+--------------+ FV Mid   Full                                                        +---------+---------------+---------+-----------+----------+--------------+ FV DistalFull                                                         +---------+---------------+---------+-----------+----------+--------------+  PFV      Full                                                        +---------+---------------+---------+-----------+----------+--------------+ POP      Full           Yes      Yes                                 +---------+---------------+---------+-----------+----------+--------------+ PTV      Full                                                        +---------+---------------+---------+-----------+----------+--------------+ PERO     Full                                                        +---------+---------------+---------+-----------+----------+--------------+   +---------+---------------+---------+-----------+----------+--------------+ LEFT     CompressibilityPhasicitySpontaneityPropertiesThrombus Aging +---------+---------------+---------+-----------+----------+--------------+ CFV      Full           Yes      Yes                                 +---------+---------------+---------+-----------+----------+--------------+ SFJ      Full                                                        +---------+---------------+---------+-----------+----------+--------------+ FV Prox  Full                                                        +---------+---------------+---------+-----------+----------+--------------+ FV Mid   Full                                                        +---------+---------------+---------+-----------+----------+--------------+ FV DistalFull                                                        +---------+---------------+---------+-----------+----------+--------------+ PFV      Full                                                        +---------+---------------+---------+-----------+----------+--------------+  POP      Full           Yes      Yes                                  +---------+---------------+---------+-----------+----------+--------------+ PTV      Full                                                        +---------+---------------+---------+-----------+----------+--------------+ PERO     Full                                                        +---------+---------------+---------+-----------+----------+--------------+     Summary: RIGHT: - There is no evidence of deep vein thrombosis in the lower extremity.  - No cystic structure found in the popliteal fossa.  LEFT: - There is no evidence of deep vein thrombosis in the lower extremity.  - No cystic structure found in the popliteal fossa.  *See table(s) above for measurements and observations. Electronically signed by Coral Else MD on 03/06/2020 at 5:34:02 PM.    Final

## 2020-03-07 NOTE — Progress Notes (Signed)
Occupational Therapy Note  SATURATION QUALIFICATIONS: (This note is used to comply with regulatory documentation for home oxygen)  Patient Saturations on Room Air at Rest = 86%  Patient Saturations on Room Air while Ambulating = 81%  Patient Saturations on 6 Liters of oxygen while Ambulating = 86%  Please briefly explain why patient needs home oxygen: Pt requires supplemental oxygen to keep saturation levels safe with activity  Marcy Siren, OT Acute Rehabilitation Services Pager [phone] Office [phone]

## 2020-03-07 NOTE — Evaluation (Signed)
Physical Therapy Evaluation Patient Details Name: Adam Edwards MRN: [id number] DOB: 05-26-1945 Today's Date: 03/07/2020   History of Present Illness  Pt is a 74 y.o.malewithhistory of hypertension presents to the ER d/t worsening shortness of breath for approx 3 days prior, in the ER he was diagnosed with severe acute hypoxic respiratory failure due to COVID-19 pneumonia and admitted to the hospital.  Clinical Impression   Pt admitted with above diagnosis. Comes from home where he loves with spouse in a two level home; Independent at baseline, and very active; Enjoys regular exercise, especially swimming; Presents to PT with decr activity tolerance; Today pt performing mobility tasks without AD at supervision level, requiring up to minA for ADL. Pt initially on 6L upon arrival, with standing activity/mobility SpO2 decreasing to low 80s requiring up to 10L O2 to increase and maintain sats in the upper 80s/lower 90s. SpO2 maintaining 88-91%% end of session on 8L HFNC with RN made aware with hopes to titrate back to 6L with continued seated rest.  Pt currently with functional limitations due to the deficits listed below (see PT Problem List). Pt will benefit from skilled PT to increase their independence and safety with mobility to allow discharge to the venue listed below.       Follow Up Recommendations No PT follow up    Equipment Recommendations  None recommended by PT    Recommendations for Other Services OT consult (as ordered)     Precautions / Restrictions Precautions Precautions: Fall Precaution Comments: watch O2 Restrictions Weight Bearing Restrictions: No      Mobility  Bed Mobility Overal bed mobility: Needs Assistance Bed Mobility: Supine to Sit     Supine to sit: Supervision     General bed mobility comments: for lines and safety, no assist required     Transfers Overall transfer level: Needs assistance Equipment used: None Transfers: Sit to/from Stand Sit  to Stand: Supervision         General transfer comment: for initial balance and safety  Ambulation/Gait Ambulation/Gait assistance: Min guard;Supervision Gait Distance (Feet): 100 Feet Assistive device: None Gait Pattern/deviations: Step-through pattern     General Gait Details: Cues ot self-monitor for activity tolerance  Stairs            Wheelchair Mobility    Modified Rankin (Stroke Patients Only)       Balance Overall balance assessment: Mild deficits observed, not formally tested                                           Pertinent Vitals/Pain Pain Assessment: No/denies pain    Home Living Family/patient expects to be discharged to:: Private residence Living Arrangements: Spouse/significant other Available Help at Discharge: Family Type of Home: House Home Access: Stairs to enter   [employer] of Steps: 1 step onto porch, 1 step into home Home Layout: Two level;Able to live on main level with bedroom/bathroom Home Equipment: None      Prior Function Level of Independence: Independent         Comments: swims 3 miles/wk      Hand Dominance        Extremity/Trunk Assessment   Upper Extremity Assessment Upper Extremity Assessment: Defer to OT evaluation    Lower Extremity Assessment Lower Extremity Assessment: Generalized weakness    Cervical / Trunk Assessment Cervical / Trunk Assessment: Normal  Communication  Communication: HOH  Cognition Arousal/Alertness: Awake/alert Behavior During Therapy: WFL for tasks assessed/performed Overall Cognitive Status: Within Functional Limits for tasks assessed                                        General Comments General comments (skin integrity, edema, etc.): Significant decr in O2 sats with activity on Room Air; incr supplemental O2 to 6-8 L via HFNC    Exercises Other Exercises Other Exercises: pt reports frequent use of IS    Assessment/Plan    PT Assessment Patient needs continued PT services  PT Problem List Decreased strength;Decreased activity tolerance;Decreased balance;Decreased mobility;Decreased knowledge of use of DME;Decreased knowledge of precautions;Cardiopulmonary status limiting activity       PT Treatment Interventions DME instruction;Gait training;Stair training;Functional mobility training;Therapeutic activities;Therapeutic exercise;Balance training;Cognitive remediation;Patient/family education    PT Goals (Current goals can be found in the Care Plan section)  Acute Rehab PT Goals Patient Stated Goal: home, back to PLOF (independent) PT Goal Formulation: With patient Time For Goal Achievement: 03/21/20 Potential to Achieve Goals: Good    Frequency Min 3X/week   Barriers to discharge        Co-evaluation PT/OT/SLP Co-Evaluation/Treatment: Yes Reason for Co-Treatment: For patient/therapist safety;To address functional/ADL transfers PT goals addressed during session: Mobility/safety with mobility OT goals addressed during session: ADL's and self-care       AM-PAC PT "6 Clicks" Mobility  Outcome Measure Help needed turning from your back to your side while in a flat bed without using bedrails?: None Help needed moving from lying on your back to sitting on the side of a flat bed without using bedrails?: None Help needed moving to and from a bed to a chair (including a wheelchair)?: None Help needed standing up from a chair using your arms (e.g., wheelchair or bedside chair)?: None Help needed to walk in hospital room?: A Little Help needed climbing 3-5 steps with a railing? : A Little 6 Click Score: 22    End of Session Equipment Utilized During Treatment: Oxygen Activity Tolerance: Patient tolerated treatment well Patient left: in chair;with call bell/phone within reach Nurse Communication: Mobility status PT Visit Diagnosis: Other abnormalities of gait and mobility  (R26.89)    Time: 2993-7169 PT Time Calculation (min) (ACUTE ONLY): 32 min   Charges:   PT Evaluation $PT Eval Moderate Complexity: 1 Mod          Van Clines, PT  Acute Rehabilitation Services Pager [phone] Office [phone]   Levi Aland 03/07/2020, 7:20 PM

## 2020-03-07 NOTE — Evaluation (Addendum)
Occupational Therapy Evaluation Patient Details Name: Adam Edwards MRN: [id number] DOB: 1945/12/26 Today's Date: 03/07/2020    History of Present Illness Pt is a 74 y.o.malewithhistory of hypertension presents to the ER d/t worsening shortness of breath for approx 3 days prior, in the ER he was diagnosed with severe acute hypoxic respiratory failure due to COVID-19 pneumonia and admitted to the hospital.   Clinical Impression   This 74 y/o male presents with the above. PTA pt reports very independent with ADL, iADL and functional mobility, is active. Pt very pleasant and willing to participate in therapy session. Today pt performing mobility tasks without AD at supervision level, requiring up to minA for ADL. Pt initially on 6L upon arrival, with standing activity/mobility SpO2 decreasing to low 80s requiring up to 10L O2 to increase and maintain sats in the upper 80s/lower 90s. SpO2 maintaining 88-91%% end of session on 8L HFNC with RN made aware with hopes to titrate back to 6L with continued seated rest. Pt to benefit from continued acute OT services to maximize his overall safety and independence with ADL and mobility. Do not anticipate he will require follow up OT services after discharge.     Follow Up Recommendations  No OT follow up;Supervision/Assistance - 24 hour (24hr initially )    Equipment Recommendations  Tub/shower seat (pending progress )           Precautions / Restrictions Precautions Precautions: Fall Precaution Comments: watch O2 Restrictions Weight Bearing Restrictions: No      Mobility Bed Mobility Overal bed mobility: Needs Assistance Bed Mobility: Supine to Sit     Supine to sit: Supervision     General bed mobility comments: for lines and safety, no assist required     Transfers Overall transfer level: Needs assistance Equipment used: None Transfers: Sit to/from Stand Sit to Stand: Supervision         General transfer comment: for initial  balance and safety    Balance Overall balance assessment: Mild deficits observed, not formally tested                                         ADL either performed or assessed with clinical judgement   ADL Overall ADL's : Needs assistance/impaired Eating/Feeding: Modified independent;Sitting   Grooming: Wash/dry hands;Supervision/safety;Standing   Upper Body Bathing: Supervision/ safety;Sitting   Lower Body Bathing: Min guard;Sit to/from stand   Upper Body Dressing : Supervision/safety;Sitting   Lower Body Dressing: Minimal assistance;Sit to/from stand   Toilet Transfer: Supervision/safety;Ambulation;Regular Toilet   Toileting- Clothing Manipulation and Hygiene: Minimal assistance;Sit to/from stand Toileting - Clothing Manipulation Details (indicate cue type and reason): assist for gown mangement initially     Functional mobility during ADLs: Supervision/safety (with and without IV pole )                           Pertinent Vitals/Pain Pain Assessment: No/denies pain     Hand Dominance     Extremity/Trunk Assessment Upper Extremity Assessment Upper Extremity Assessment: Overall WFL for tasks assessed   Lower Extremity Assessment Lower Extremity Assessment: Defer to PT evaluation   Cervical / Trunk Assessment Cervical / Trunk Assessment: Normal   Communication Communication Communication: HOH   Cognition Arousal/Alertness: Awake/alert Behavior During Therapy: WFL for tasks assessed/performed Overall Cognitive Status: Within Functional Limits for tasks assessed  General Comments       Exercises Exercises: Other exercises Other Exercises Other Exercises: pt reports frequent use of IS   Shoulder Instructions      Home Living Family/patient expects to be discharged to:: Private residence Living Arrangements: Spouse/significant other Available Help at Discharge: Family Type of  Home: House Home Access: Stairs to enter [employer] of Steps: 1 step onto porch, 1 step into home   Home Layout: Two level;Able to live on main level with bedroom/bathroom     Bathroom Shower/Tub: Tub/shower unit;Walk-in shower (walk-in shower is upstairs)   Firefighter: Standard     Home Equipment: None          Prior Functioning/Environment Level of Independence: Independent        Comments: swims 3 miles/wk         OT Problem List: Decreased strength;Decreased activity tolerance;Impaired balance (sitting and/or standing);Cardiopulmonary status limiting activity;Decreased knowledge of use of DME or AE      OT Treatment/Interventions: Self-care/ADL training;Therapeutic exercise;Energy conservation;DME and/or AE instruction;Therapeutic activities;Patient/family education;Balance training    OT Goals(Current goals can be found in the care plan section) Acute Rehab OT Goals Patient Stated Goal: home, back to PLOF (independent) OT Goal Formulation: With patient Time For Goal Achievement: 03/21/20 Potential to Achieve Goals: Good  OT Frequency: Min 2X/week   Barriers to D/C:            Co-evaluation PT/OT/SLP Co-Evaluation/Treatment: Yes Reason for Co-Treatment: For patient/therapist safety;To address functional/ADL transfers   OT goals addressed during session: ADL's and self-care      AM-PAC OT "6 Clicks" Daily Activity     Outcome Measure Help from another person eating meals?: None Help from another person taking care of personal grooming?: A Little Help from another person toileting, which includes using toliet, bedpan, or urinal?: A Little Help from another person bathing (including washing, rinsing, drying)?: A Little Help from another person to put on and taking off regular upper body clothing?: A Little Help from another person to put on and taking off regular lower body clothing?: A Little 6 Click Score: 19   End of Session Equipment  Utilized During Treatment: Gait belt;Oxygen Nurse Communication: Mobility status  Activity Tolerance: Patient tolerated treatment well Patient left: in chair;with call bell/phone within reach  OT Visit Diagnosis: Muscle weakness (generalized) (M62.81);Other (comment) (decreased activity tolerance )                Time: 5400-8676 OT Time Calculation (min): 32 min Charges:  OT General Charges $OT Visit: 1 Visit OT Evaluation $OT Eval Moderate Complexity: 1 Mod  Marcy Siren, OT Acute Rehabilitation Services Pager [phone] Office [phone]  Orlando Penner 03/07/2020, 4:49 PM

## 2020-03-08 LAB — CBC WITH DIFFERENTIAL/PLATELET
Abs Immature Granulocytes: 0 10*3/uL (ref 0.00–0.07)
Basophils Absolute: 0 10*3/uL (ref 0.0–0.1)
Basophils Relative: 0 %
Eosinophils Absolute: 0 10*3/uL (ref 0.0–0.5)
Eosinophils Relative: 0 %
HCT: 38.3 % — ABNORMAL LOW (ref 39.0–52.0)
Hemoglobin: 14.2 g/dL (ref 13.0–17.0)
Lymphocytes Relative: 0 %
Lymphs Abs: 0 10*3/uL — ABNORMAL LOW (ref 0.7–4.0)
MCH: 31.8 pg (ref 26.0–34.0)
MCHC: 37.1 g/dL — ABNORMAL HIGH (ref 30.0–36.0)
MCV: 85.7 fL (ref 80.0–100.0)
Monocytes Absolute: 0.7 10*3/uL (ref 0.1–1.0)
Monocytes Relative: 4 %
Neutro Abs: 15.9 10*3/uL — ABNORMAL HIGH (ref 1.7–7.7)
Neutrophils Relative %: 96 %
Platelets: 321 10*3/uL (ref 150–400)
RBC: 4.47 MIL/uL (ref 4.22–5.81)
RDW: 11.7 % (ref 11.5–15.5)
WBC: 16.6 10*3/uL — ABNORMAL HIGH (ref 4.0–10.5)
nRBC: 0 % (ref 0.0–0.2)
nRBC: 0 /100 WBC

## 2020-03-08 LAB — COMPREHENSIVE METABOLIC PANEL
ALT: 43 U/L (ref 0–44)
AST: 70 U/L — ABNORMAL HIGH (ref 15–41)
Albumin: 2.6 g/dL — ABNORMAL LOW (ref 3.5–5.0)
Alkaline Phosphatase: 86 U/L (ref 38–126)
Anion gap: 9 (ref 5–15)
BUN: 20 mg/dL (ref 8–23)
CO2: 24 mmol/L (ref 22–32)
Calcium: 8.6 mg/dL — ABNORMAL LOW (ref 8.9–10.3)
Chloride: 100 mmol/L (ref 98–111)
Creatinine, Ser: 1.08 mg/dL (ref 0.61–1.24)
GFR, Estimated: >60 mL/min (ref >60)
Glucose, Bld: 143 mg/dL — ABNORMAL HIGH (ref 70–99)
Potassium: 3.8 mmol/L (ref 3.5–5.1)
Sodium: 133 mmol/L — ABNORMAL LOW (ref 135–145)
Total Bilirubin: 1 mg/dL (ref 0.3–1.2)
Total Protein: 5.3 g/dL — ABNORMAL LOW (ref 6.5–8.1)

## 2020-03-08 LAB — PROCALCITONIN: Procalcitonin: 0.26 ng/mL

## 2020-03-08 LAB — MAGNESIUM: Magnesium: 2.1 mg/dL (ref 1.7–2.4)

## 2020-03-08 LAB — C-REACTIVE PROTEIN: CRP: 1.7 mg/dL — ABNORMAL HIGH (ref <1.0)

## 2020-03-08 LAB — BRAIN NATRIURETIC PEPTIDE: B Natriuretic Peptide: 149.8 pg/mL — ABNORMAL HIGH (ref 0.0–100.0)

## 2020-03-08 LAB — D-DIMER, QUANTITATIVE: D-Dimer, Quant: 1.44 ug/mL-FEU — ABNORMAL HIGH (ref 0.00–0.50)

## 2020-03-08 MED ORDER — METHYLPREDNISOLONE SODIUM SUCC 125 MG IJ SOLR
60.0000 mg | Freq: Every day | INTRAMUSCULAR | Status: DC
  Administered 2020-03-08 – 2020-03-10 (×3): 60 mg via INTRAVENOUS
  Filled 2020-03-08 (×3): qty 2

## 2020-03-08 NOTE — Progress Notes (Signed)
PROGRESS NOTE                                                                                                                                                                                                             Patient Demographics:    Adam Edwards, is a 74 y.o. male, DOB - [birth year], ZOX:[id number]  Outpatient Primary MD for the patient is Dennard Schaumann, MD    LOS - 4  Admit date - 03/04/2020    Chief Complaint  Patient presents with  . Covid Exposure  . Shortness of Breath       Brief Narrative (HPI from H&P)  - Adam Edwards is a 74 y.o. male with history of hypertension presents to the ER because of worsening shortness of breath for the last 3 days prior to hospital visit, in the ER he was diagnosed with severe acute hypoxic respiratory failure due to COVID-19 pneumonia and admitted to the hospital.   Subjective:   Patient in bed, appears comfortable, denies any headache, no fever, no chest pain or pressure, improved shortness of breath , no abdominal pain. No focal weakness.   Assessment  & Plan :     1. Acute Hypoxic Resp. Failure due to Acute Covid 19 Viral Pneumonitis during the ongoing 2020 Covid 19 Pandemic - he is unfortunately unvaccinated and has incurred severe parenchymal lung injury due to COVID-19 pneumonia, he has been treated with IV steroids, Remdesivir and Actemra combination.  Some clinical improvement after Actemra dose down from 15 L nonrebreather to 5 L of oxygen, still overall tenuous we will continue to monitor closely.  Encouraged the patient to sit up in chair in the daytime use I-S and flutter valve for pulmonary toiletry and then prone in bed when at night.  Will advance activity and titrate down oxygen as possible.  SpO2: 99 % O2 Flow Rate (L/min): 5 L/min  Recent Labs  Lab 03/04/20 1933 03/04/20 2103 03/05/20 0106 03/05/20 0401 03/06/20 0227 03/06/20 0229 03/07/20 0442  03/08/20 0414  WBC 5.1  --   --  4.4 6.8  --  17.1* 16.6*  HGB 16.3  --   --  14.8 15.4  --  14.4 14.2  HCT 47.5  --   --  40.0 42.6  --  39.9 38.3*  PLT 161  --   --  174 194  --  265 321  CRP 11.3*  --   --  11.1* 9.6*  --  3.3* 1.7*  BNP  --   --   --   --   --  93.1 117.7* 149.8*  DDIMER 1.92*  --   --  2.00* 3.18*  --  1.28* 1.44*  PROCALCITON 0.19  --   --  0.19  --   --  0.16 0.26  AST 69*  --   --  59* 87*  --  71* 70*  ALT 33  --   --  30 40  --  40 43  ALKPHOS 102  --   --  88 93  --  78 86  BILITOT 1.0  --   --  0.9 1.0  --  0.9 1.0  ALBUMIN 3.2*  --   --  2.7* 2.6*  --  2.4* 2.6*  LATICACIDVEN  --  1.7 1.7  --   --   --   --   --   SARSCOV2NAA POSITIVE*  --   --   --   --   --   --   --     2.  Essential hypertension.  Currently stable on Norvasc monitor and adjust.  3.  Dyslipidemia.  On statin.  4.  GERD.  On PPI.  5.  Hypokalemia and hyponatremia. Due to dehydration improved with IV fluids and potassium replacement.  6.  Mildly elevated D-dimer due to intense inflammation.  On moderate dose Lovenox, leg ultrasound negative.  Trend D-dimer.     Condition - Extremely Guarded  Family Communication  : Updated daughter Toniann FailWendy [phone] on 03/06/20  Code Status :  Full  Consults  :  None  Procedures  :   Leg US - No DVT  CTA -  No pulmonary embolism. Asymmetric bilateral pulmonary infiltrate infiltrates in keeping with atypical infection and typical of that seen with acute COVID-19 pneumonia. Moderate to severe parenchymal involvement. Mild coronary artery calcification. Aortic Atherosclerosis (ICD10-I70.0).    PUD Prophylaxis : PPI  Disposition Plan  :    Status is: Inpatient  Remains inpatient appropriate because:IV treatments appropriate due to intensity of illness or inability to take PO   Dispo: The patient is from: Home              Anticipated d/c is to: Home              Anticipated d/c date is: > 3 days              Patient currently is  not medically stable to d/c.   DVT Prophylaxis  :  Lovenox    Lab Results  Component Value Date   PLT 321 03/08/2020    Diet :  Diet Order            Diet Heart Room service appropriate? Yes; Fluid consistency: Thin  Diet effective now                  Inpatient Medications  Scheduled Meds: . amLODipine  10 mg Oral Daily  . vitamin C  500 mg Oral Daily  . atorvastatin  40 mg Oral QHS  . enoxaparin (LOVENOX) injection  0.5 mg/kg Subcutaneous BID  . FLUoxetine  20 mg Oral Daily  . levothyroxine  100 mcg Oral QAC breakfast  . methylPREDNISolone (SOLU-MEDROL) injection  60 mg Intravenous Daily  . pantoprazole  40 mg Oral Q1200  . zinc  sulfate  220 mg Oral Daily   Continuous Infusions: . remdesivir 100 mg in NS 100 mL 100 mg (03/08/20 0932)   PRN Meds:.chlorpheniramine-HYDROcodone, guaiFENesin-dextromethorphan, hydrALAZINE  Antibiotics  :    Anti-infectives (From admission, onward)   Start     Dose/Rate Route Frequency Ordered Stop   03/05/20 1000  remdesivir 100 mg in sodium chloride 0.9 % 100 mL IVPB       "Followed by" Linked Group Details   100 mg 200 mL/hr over 30 Minutes Intravenous Daily 03/04/20 2305 03/09/20 0959   03/04/20 2315  remdesivir 200 mg in sodium chloride 0.9% 250 mL IVPB       "Followed by" Linked Group Details   200 mg 580 mL/hr over 30 Minutes Intravenous Once 03/04/20 2305 03/05/20 0200       Time Spent in minutes  30   Susa Raring M.D on 03/08/2020 at 9:46 AM  To page go to [email or web address] - password [facility]  [facility] -  Office  [phone]   See all Orders from today for further details    Objective:   Vitals:   03/08/20 0000 03/08/20 0420 03/08/20 0733 03/08/20 0909  BP: (!) 114/58 135/83 130/66 131/85  Pulse: (!) 58 87 68   Resp: 18 20 20    Temp: 98 F (36.7 C) 97.9 F (36.6 C) 98 F (36.7 C)   TempSrc: Oral Oral Oral   SpO2: 100% 90% 99%   Weight:      Height:        Wt Readings from Last 3 Encounters:   03/04/20 99.8 kg     Intake/Output Summary (Last 24 hours) at 03/08/2020 0946 Last data filed at 03/08/2020 0900 Gross per 24 hour  Intake 1767.56 ml  Output 800 ml  Net 967.56 ml     Physical Exam  Awake Alert, No new F.N deficits, Normal affect [place].AT,PERRAL Supple Neck,No JVD, No cervical lymphadenopathy appriciated.  Symmetrical Chest wall movement, Good air movement bilaterally, CTAB RRR,No Gallops, Rubs or new Murmurs, No Parasternal Heave +ve B.Sounds, Abd Soft, No tenderness, No organomegaly appriciated, No rebound - guarding or rigidity. No Cyanosis, Clubbing or edema, No new Rash or bruise     Data Review:    CBC Recent Labs  Lab 03/04/20 1933 03/05/20 0401 03/06/20 0227 03/07/20 0442 03/08/20 0414  WBC 5.1 4.4 6.8 17.1* 16.6*  HGB 16.3 14.8 15.4 14.4 14.2  HCT 47.5 40.0 42.6 39.9 38.3*  PLT 161 174 194 265 321  MCV 90.3 86.6 85.7 85.3 85.7  MCH 31.0 32.0 31.0 30.8 31.8  MCHC 34.3 37.0* 36.2* 36.1* 37.1*  RDW 11.4* 11.5 11.5 11.6 11.7  LYMPHSABS 0.7 0.6*  --  1.3 0.0*  MONOABS 0.3 0.4  --  1.4* 0.7  EOSABS 0.0 0.0  --  0.0 0.0  BASOSABS 0.1 0.0  --  0.1 0.0    Recent Labs  Lab 03/04/20 1933 03/04/20 2103 03/05/20 0106 03/05/20 0401 03/06/20 0227 03/06/20 0229 03/07/20 0442 03/08/20 0414  NA 127*  --   --  127* 132*  --  131* 133*  K 3.3*  --   --  3.3* 3.4*  --  3.2* 3.8  CL 90*  --   --  91* 96*  --  97* 100  CO2 22  --   --  22 22  --  25 24  GLUCOSE 99  --   --  136* 174*  --  156* 143*  BUN 9  --   --  11 16  --  18 20  CREATININE 1.12  --   --  1.09 1.13  --  0.99 1.08  CALCIUM 9.1  --   --  8.5* 9.1  --  8.5* 8.6*  AST 69*  --   --  59* 87*  --  71* 70*  ALT 33  --   --  30 40  --  40 43  ALKPHOS 102  --   --  88 93  --  78 86  BILITOT 1.0  --   --  0.9 1.0  --  0.9 1.0  ALBUMIN 3.2*  --   --  2.7* 2.6*  --  2.4* 2.6*  MG  --   --   --   --   --   --  1.9 2.1  CRP 11.3*  --   --  11.1* 9.6*  --  3.3* 1.7*  DDIMER 1.92*  --   --   2.00* 3.18*  --  1.28* 1.44*  PROCALCITON 0.19  --   --  0.19  --   --  0.16 0.26  LATICACIDVEN  --  1.7 1.7  --   --   --   --   --   BNP  --   --   --   --   --  93.1 117.7* 149.8*    ------------------------------------------------------------------------------------------------------------------ No results for input(s): CHOL, HDL, LDLCALC, TRIG, CHOLHDL, LDLDIRECT in the last 72 hours.  No results found for: HGBA1C ------------------------------------------------------------------------------------------------------------------ No results for input(s): TSH, T4TOTAL, T3FREE, THYROIDAB in the last 72 hours.  Invalid input(s): FREET3  Cardiac Enzymes No results for input(s): CKMB, TROPONINI, MYOGLOBIN in the last 168 hours.  Invalid input(s): CK ------------------------------------------------------------------------------------------------------------------    Component Value Date/Time   BNP 149.8 (H) 03/08/2020 0414    Micro Results Recent Results (from the past 240 hour(s))  Resp Panel by RT-PCR (Flu A&B, Covid) Nasopharyngeal Swab     Status: Abnormal   Collection Time: 03/04/20  7:33 PM   Specimen: Nasopharyngeal Swab; Nasopharyngeal(NP) swabs in vial transport medium  Result Value Ref Range Status   SARS Coronavirus 2 by RT PCR POSITIVE (A) NEGATIVE Final    Comment: RESULT CALLED TO, READ BACK BY AND VERIFIED WITH: Andree Elk RN 03/04/20 AT 2153 SK (NOTE) SARS-CoV-2 target nucleic acids are DETECTED.  The SARS-CoV-2 RNA is generally detectable in upper respiratory specimens during the acute phase of infection. Positive results are indicative of the presence of the identified virus, but do not rule out bacterial infection or co-infection with other pathogens not detected by the test. Clinical correlation with patient history and other diagnostic information is necessary to determine patient infection status. The expected result is Negative.  Fact Sheet  for Patients: BloggerCourse.com  Fact Sheet for Healthcare Providers: SeriousBroker.it  This test is not yet approved or cleared by the [place] FDA and  has been authorized for detection and/or diagnosis of SARS-CoV-2 by FDA under an Emergency Use Authorization (EUA).  This EUA will remain in effect (meaning this te st can be used) for the duration of  the COVID-19 declaration under Section 564(b)(1) of the Act, 21 U.S.C. section 360bbb-3(b)(1), unless the authorization is terminated or revoked sooner.     Influenza A by PCR NEGATIVE NEGATIVE Final   Influenza B by PCR NEGATIVE NEGATIVE Final    Comment: (NOTE) The Xpert Xpress SARS-CoV-2/FLU/RSV plus assay is intended as an aid in the diagnosis of influenza from Nasopharyngeal swab  specimens and should not be used as a sole basis for treatment. Nasal washings and aspirates are unacceptable for Xpert Xpress SARS-CoV-2/FLU/RSV testing.  Fact Sheet for Patients: BloggerCourse.com  Fact Sheet for Healthcare Providers: SeriousBroker.it  This test is not yet approved or cleared by the [place] FDA and has been authorized for detection and/or diagnosis of SARS-CoV-2 by FDA under an Emergency Use Authorization (EUA). This EUA will remain in effect (meaning this test can be used) for the duration of the COVID-19 declaration under Section 564(b)(1) of the Act, 21 U.S.C. section 360bbb-3(b)(1), unless the authorization is terminated or revoked.  Performed at [facility], [address], [place], [place] [address]   Blood Culture (routine x 2)     Status: None (Preliminary result)   Collection Time: 03/04/20  8:45 PM   Specimen: BLOOD LEFT HAND  Result Value Ref Range Status   Specimen Description BLOOD LEFT HAND  Final   Special Requests   Final    AEROBIC BOTTLE ONLY Blood Culture results may not be optimal due to  an inadequate volume of blood received in culture bottles   Culture   Final    NO GROWTH 4 DAYS Performed at [facility], [address] [place], [place] [address]    Report Status PENDING  Incomplete  Blood Culture (routine x 2)     Status: None (Preliminary result)   Collection Time: 03/05/20 12:16 AM   Specimen: BLOOD LEFT FOREARM  Result Value Ref Range Status   Specimen Description BLOOD LEFT FOREARM  Final   Special Requests   Final    BOTTLES DRAWN AEROBIC AND ANAEROBIC Blood Culture adequate volume   Culture   Final    NO GROWTH 3 DAYS Performed at [facility], [address] [place], [place] [address]    Report Status PENDING  Incomplete  MRSA PCR Screening     Status: None   Collection Time: 03/06/20  2:36 PM   Specimen: Nasal Mucosa; Nasopharyngeal  Result Value Ref Range Status   MRSA by PCR NEGATIVE NEGATIVE Final    Comment:        The GeneXpert MRSA Assay (FDA approved for NASAL specimens only), is one component of a comprehensive MRSA colonization surveillance program. It is not intended to diagnose MRSA infection nor to guide or monitor treatment for MRSA infections. Performed at [facility], [address] [place], [place] [address]     Radiology Reports CT ANGIO CHEST PE W OR WO CONTRAST  Result Date: 03/05/2020 CLINICAL DATA:  Pulmonary embolism, dyspnea, COVID pneumonia EXAM: CT ANGIOGRAPHY CHEST WITH CONTRAST TECHNIQUE: Multidetector CT imaging of the chest was performed using the standard protocol during bolus administration of intravenous contrast. Multiplanar CT image reconstructions and MIPs were obtained to evaluate the vascular anatomy. CONTRAST:  24mL OMNIPAQUE IOHEXOL 350 MG/ML SOLN COMPARISON:  None. FINDINGS: Cardiovascular: There is adequate opacification of the pulmonary arterial tree. No intraluminal filling defect identified to suggest acute pulmonary embolism. The pulmonary arteries are of normal caliber. Mild  coronary artery calcification. Global cardiac size within normal limits. No pericardial effusion. Thoracic aorta demonstrates mild atherosclerotic calcification within the arch. No aortic aneurysm. Mediastinum/Nodes: No enlarged mediastinal, hilar, or axillary lymph nodes. Thyroid gland, trachea, and esophagus demonstrate no significant findings. Small hiatal hernia noted. Lungs/Pleura: There is extensive bilateral asymmetric diffuse ground-glass pulmonary infiltrate and peripheral areas of consolidation in keeping with changes of atypical infection in the acute setting and  typical of that seen with COVID-19 pneumonia. There is moderate to severe parenchymal involvement. No pneumothorax or pleural effusion. No central obstructing lesion. Upper Abdomen: No acute abnormality. Musculoskeletal: No chest wall abnormality. No acute or significant osseous findings. Review of the MIP images confirms the above findings. IMPRESSION: No pulmonary embolism. Asymmetric bilateral pulmonary infiltrate infiltrates in keeping with atypical infection and typical of that seen with acute COVID-19 pneumonia. Moderate to severe parenchymal involvement. Mild coronary artery calcification. Aortic Atherosclerosis (ICD10-I70.0). Electronically Signed   By: Helyn Numbers MD   On: 03/05/2020 06:57   DG Chest Port 1 View  Result Date: 03/06/2020 CLINICAL DATA:  Shortness of breath.  COVID-19 positive. EXAM: PORTABLE CHEST 1 VIEW COMPARISON:  03/04/2020 and chest CT 03/05/2020 FINDINGS: Parenchymal densities in the right upper lung have progressed since 03/04/2020. Findings are similar to the recent CT findings. Persistent patchy densities in the mid and lower left lung. Heart size is grossly stable. Negative for pneumothorax. IMPRESSION: Bilateral parenchymal densities are compatible with bilateral pneumonia. Progression of disease since 03/04/2020. The distribution of disease is compatible with COVID-19 infection. Electronically Signed    By: Richarda Overlie M.D.   On: 03/06/2020 08:52   DG Chest Port 1 View  Result Date: 03/04/2020 CLINICAL DATA:  Shortness of breath for 3 days. History of COVID-19 exposure. EXAM: PORTABLE CHEST 1 VIEW COMPARISON:  None. FINDINGS: The patient has bilateral airspace disease with a peripheral predominance. Heart size is normal. No pneumothorax or pleural effusion. IMPRESSION: Bilateral pneumonia with an appearance compatible with COVID-19 infection. Electronically Signed   By: Drusilla Kanner M.D.   On: 03/04/2020 20:32   VAS [place] LOWER EXTREMITY VENOUS (DVT)  Result Date: 03/06/2020  Lower Venous DVT Study Indications: Elevated Ddimer.  Risk Factors: COVID 19 positive. Comparison Study: No prior studies. Performing Technologist: Chanda Busing RVT  Examination Guidelines: A complete evaluation includes B-mode imaging, spectral Doppler, color Doppler, and power Doppler as needed of all accessible portions of each vessel. Bilateral testing is considered an integral part of a complete examination. Limited examinations for reoccurring indications may be performed as noted. The reflux portion of the exam is performed with the patient in reverse Trendelenburg.  +---------+---------------+---------+-----------+----------+--------------+ RIGHT    CompressibilityPhasicitySpontaneityPropertiesThrombus Aging +---------+---------------+---------+-----------+----------+--------------+ CFV      Full           Yes      Yes                                 +---------+---------------+---------+-----------+----------+--------------+ SFJ      Full                                                        +---------+---------------+---------+-----------+----------+--------------+ FV Prox  Full                                                        +---------+---------------+---------+-----------+----------+--------------+ FV Mid   Full                                                         +---------+---------------+---------+-----------+----------+--------------+  FV DistalFull                                                        +---------+---------------+---------+-----------+----------+--------------+ PFV      Full                                                        +---------+---------------+---------+-----------+----------+--------------+ POP      Full           Yes      Yes                                 +---------+---------------+---------+-----------+----------+--------------+ PTV      Full                                                        +---------+---------------+---------+-----------+----------+--------------+ PERO     Full                                                        +---------+---------------+---------+-----------+----------+--------------+   +---------+---------------+---------+-----------+----------+--------------+ LEFT     CompressibilityPhasicitySpontaneityPropertiesThrombus Aging +---------+---------------+---------+-----------+----------+--------------+ CFV      Full           Yes      Yes                                 +---------+---------------+---------+-----------+----------+--------------+ SFJ      Full                                                        +---------+---------------+---------+-----------+----------+--------------+ FV Prox  Full                                                        +---------+---------------+---------+-----------+----------+--------------+ FV Mid   Full                                                        +---------+---------------+---------+-----------+----------+--------------+ FV DistalFull                                                        +---------+---------------+---------+-----------+----------+--------------+   PFV      Full                                                         +---------+---------------+---------+-----------+----------+--------------+ POP      Full           Yes      Yes                                 +---------+---------------+---------+-----------+----------+--------------+ PTV      Full                                                        +---------+---------------+---------+-----------+----------+--------------+ PERO     Full                                                        +---------+---------------+---------+-----------+----------+--------------+     Summary: RIGHT: - There is no evidence of deep vein thrombosis in the lower extremity.  - No cystic structure found in the popliteal fossa.  LEFT: - There is no evidence of deep vein thrombosis in the lower extremity.  - No cystic structure found in the popliteal fossa.  *See table(s) above for measurements and observations. Electronically signed by Coral Else MD on 03/06/2020 at 5:34:02 PM.    Final

## 2020-03-09 ENCOUNTER — Inpatient Hospital Stay (HOSPITAL_COMMUNITY): Payer: No Typology Code available for payment source

## 2020-03-09 LAB — CBC WITH DIFFERENTIAL/PLATELET
Abs Immature Granulocytes: 0.21 10*3/uL — ABNORMAL HIGH (ref 0.00–0.07)
Basophils Absolute: 0 10*3/uL (ref 0.0–0.1)
Basophils Relative: 0 %
Eosinophils Absolute: 0 10*3/uL (ref 0.0–0.5)
Eosinophils Relative: 0 %
HCT: 37.2 % — ABNORMAL LOW (ref 39.0–52.0)
Hemoglobin: 13.9 g/dL (ref 13.0–17.0)
Immature Granulocytes: 1 %
Lymphocytes Relative: 7 %
Lymphs Abs: 1.2 10*3/uL (ref 0.7–4.0)
MCH: 31.7 pg (ref 26.0–34.0)
MCHC: 37.4 g/dL — ABNORMAL HIGH (ref 30.0–36.0)
MCV: 84.7 fL (ref 80.0–100.0)
Monocytes Absolute: 1.2 10*3/uL — ABNORMAL HIGH (ref 0.1–1.0)
Monocytes Relative: 7 %
Neutro Abs: 16 10*3/uL — ABNORMAL HIGH (ref 1.7–7.7)
Neutrophils Relative %: 85 %
Platelets: 322 10*3/uL (ref 150–400)
RBC: 4.39 MIL/uL (ref 4.22–5.81)
RDW: 11.7 % (ref 11.5–15.5)
WBC: 18.7 10*3/uL — ABNORMAL HIGH (ref 4.0–10.5)
nRBC: 0 % (ref 0.0–0.2)

## 2020-03-09 LAB — PROCALCITONIN: Procalcitonin: 0.12 ng/mL

## 2020-03-09 LAB — COMPREHENSIVE METABOLIC PANEL
ALT: 63 U/L — ABNORMAL HIGH (ref 0–44)
AST: 86 U/L — ABNORMAL HIGH (ref 15–41)
Albumin: 2.4 g/dL — ABNORMAL LOW (ref 3.5–5.0)
Alkaline Phosphatase: 85 U/L (ref 38–126)
Anion gap: 9 (ref 5–15)
BUN: 17 mg/dL (ref 8–23)
CO2: 27 mmol/L (ref 22–32)
Calcium: 8.5 mg/dL — ABNORMAL LOW (ref 8.9–10.3)
Chloride: 99 mmol/L (ref 98–111)
Creatinine, Ser: 1.03 mg/dL (ref 0.61–1.24)
GFR, Estimated: >60 mL/min (ref >60)
Glucose, Bld: 121 mg/dL — ABNORMAL HIGH (ref 70–99)
Potassium: 3.1 mmol/L — ABNORMAL LOW (ref 3.5–5.1)
Sodium: 135 mmol/L (ref 135–145)
Total Bilirubin: 0.7 mg/dL (ref 0.3–1.2)
Total Protein: 4.9 g/dL — ABNORMAL LOW (ref 6.5–8.1)

## 2020-03-09 LAB — CULTURE, BLOOD (ROUTINE X 2): Culture: NO GROWTH

## 2020-03-09 LAB — D-DIMER, QUANTITATIVE: D-Dimer, Quant: 1.42 ug/mL-FEU — ABNORMAL HIGH (ref 0.00–0.50)

## 2020-03-09 LAB — BRAIN NATRIURETIC PEPTIDE: B Natriuretic Peptide: 152 pg/mL — ABNORMAL HIGH (ref 0.0–100.0)

## 2020-03-09 LAB — MAGNESIUM: Magnesium: 1.9 mg/dL (ref 1.7–2.4)

## 2020-03-09 LAB — C-REACTIVE PROTEIN: CRP: 0.9 mg/dL (ref <1.0)

## 2020-03-09 MED ORDER — POTASSIUM CHLORIDE CRYS ER 20 MEQ PO TBCR
40.0000 meq | EXTENDED_RELEASE_TABLET | Freq: Two times a day (BID) | ORAL | Status: AC
  Administered 2020-03-09 (×2): 40 meq via ORAL
  Filled 2020-03-09 (×2): qty 2

## 2020-03-09 MED ORDER — ONDANSETRON HCL 4 MG/2ML IJ SOLN
4.0000 mg | Freq: Four times a day (QID) | INTRAMUSCULAR | Status: DC | PRN
  Filled 2020-03-09: qty 2

## 2020-03-09 MED ORDER — DOXYCYCLINE HYCLATE 100 MG PO TABS
100.0000 mg | ORAL_TABLET | Freq: Two times a day (BID) | ORAL | Status: DC
  Administered 2020-03-09 – 2020-03-10 (×4): 100 mg via ORAL
  Filled 2020-03-09 (×4): qty 1

## 2020-03-09 MED ORDER — LOPERAMIDE HCL 2 MG PO CAPS
2.0000 mg | ORAL_CAPSULE | Freq: Four times a day (QID) | ORAL | Status: DC | PRN
  Administered 2020-03-09: 2 mg via ORAL
  Filled 2020-03-09: qty 1

## 2020-03-09 NOTE — TOC Initial Note (Signed)
Transition of Care (TOC) - Initial/Assessment Note  Donn Pierini RN, BSN Transitions of Care Unit 4E- RN Case Manager See Treatment Team for direct phone # Cross Coverage for 5W   Patient Details  Name: Adam Edwards MRN: [id number] Date of Birth: 1945/08/10  Transition of Care [facility]) CM/SW Contact:    Darrold Span, RN Phone Number: 03/09/2020, 4:00 PM  Clinical Narrative:                 Pt from home with wife, admitted with COVID, noted order for home 02 needs. Attempted to reach patient via TC at bedside- no answer- call made to daughter [name] to discuss transition needs. Per TC conversation with [name]- plan for pt to return home with family. Address confirmed in epic. Discussed home 02 needs and choice for DME agency and coverage under [facility] vs using Medicare benefits- per daughter pt has [facility] care at the [facility]- and is 100% service connected. They would prefer to use the VA benefits for home 02 if able to.  Explained that this writer would contact the [facility] to see if they would process and approve.  Call made to [facility]- home 02 extension- msg left for Debbie regarding need for home 02- awaiting return call.   Expected Discharge Plan: Home/Self Care Barriers to Discharge: Continued Medical Work up   Patient Goals and CMS Choice Patient states their goals for this hospitalization and ongoing recovery are:: return home CMS Medicare.gov Compare Post Acute Care list provided to:: Patient Represenative (must comment) (daughterUrsula Alert) Choice offered to / list presented to : Adult Children  Expected Discharge Plan and Services Expected Discharge Plan: Home/Self Care   Discharge Planning Services: CM Consult Post Acute Care Choice: Durable Medical Equipment Living arrangements for the past 2 months: Single Family Home                 DME Arranged: Oxygen DME Agency: [facility], [place] Date DME Agency Contacted: 03/09/20 Time DME Agency Contacted: [phone]    HH Arranged: NA HH Agency: NA        Prior Living Arrangements/Services Living arrangements for the past 2 months: Single Family Home Lives with:: Spouse, Adult Children, Self Patient language and need for interpreter reviewed:: Yes Do you feel safe going back to the place where you live?: Yes      Need for Family Participation in Patient Care: Yes (Comment) Care giver support system in place?: Yes (comment)   Criminal Activity/Legal Involvement Pertinent to Current Situation/Hospitalization: No - Comment as needed  Activities of Daily Living Home Assistive Devices/Equipment: None ADL Screening (condition at time of admission) Patient's cognitive ability adequate to safely complete daily activities?: Yes Is the patient deaf or have difficulty hearing?: Yes Does the patient have difficulty seeing, even when wearing glasses/contacts?: No Does the patient have difficulty concentrating, remembering, or making decisions?: No Patient able to express need for assistance with ADLs?: Yes Does the patient have difficulty dressing or bathing?: No Independently performs ADLs?: Yes (appropriate for developmental age) Does the patient have difficulty walking or climbing stairs?: No Weakness of Legs: None Weakness of Arms/Hands: None  Permission Sought/Granted Permission sought to share information with : Facility Industrial/product designer granted to share information with : Yes, Verbal Permission Granted  Share Information with NAME: Beau Fanny  Permission granted to share info w AGENCY: [facility]  Permission granted to share info w Relationship: daughter     Emotional Assessment   Attitude/Demeanor/Rapport: Unable to Assess  Affect (typically observed): Unable to Assess     Psych Involvement: No (comment)  Admission diagnosis:  Acute respiratory failure (HCC) [J96.00] SOB (shortness of breath) [R06.02] Hypoxia [R09.02] COVID-19 [U07.1] Patient Active Problem List   Diagnosis Date  Noted  . COVID-19 03/05/2020  . Essential hypertension 03/05/2020  . Acute respiratory failure (HCC) 03/04/2020   PCP:  Dennard Schaumann, MD Pharmacy:   [facility] [address]), [place] - [address] [place] ([place]) [place] [address] Phone: [phone] Fax: [phone]     Social Determinants of Health (SDOH) Interventions    Readmission Risk Interventions No flowsheet data found.

## 2020-03-09 NOTE — Progress Notes (Signed)
SATURATION QUALIFICATIONS: (This note is used to comply with regulatory documentation for home oxygen)  Patient Saturations on Room Air at Rest = 89%  Patient Saturations on Room Air while Ambulating = 85-89%

## 2020-03-09 NOTE — Progress Notes (Signed)
PROGRESS NOTE                                                                                                                                                                                                             Patient Demographics:    Adam Edwards, is a 74 y.o. male, DOB - [birth year], ZOX:[id number]  Outpatient Primary MD for the patient is Dennard Schaumann, MD    LOS - 5  Admit date - 03/04/2020    Chief Complaint  Patient presents with  . Covid Exposure  . Shortness of Breath       Brief Narrative (HPI from H&P)  - Adam Edwards is a 74 y.o. male with history of hypertension presents to the ER because of worsening shortness of breath for the last 3 days prior to hospital visit, in the ER he was diagnosed with severe acute hypoxic respiratory failure due to COVID-19 pneumonia and admitted to the hospital.   Subjective:   Patient in bed, appears comfortable, denies any headache, no fever, no chest pain or pressure, improved shortness of breath , no abdominal pain. No focal weakness.    Assessment  & Plan :     1. Acute Hypoxic Resp. Failure due to Acute Covid 19 Viral Pneumonitis during the ongoing 2020 Covid 19 Pandemic - he is unfortunately unvaccinated and has incurred severe parenchymal lung injury due to COVID-19 pneumonia, he has been treated with IV steroids, Remdesivir and Actemra combination.  Some clinical improvement after Actemra dose down from 15 L nonrebreather to 3 L of oxygen, still overall tenuous we will continue to monitor closely.  Most likely will be discharged home on 03/10/2020 with steroid taper, oral doxycycline and rescue inhaler.  Encouraged the patient to sit up in chair in the daytime use I-S and flutter valve for pulmonary toiletry and then prone in bed when at night.  Will advance activity and titrate down oxygen as possible.  SpO2: 91 % O2 Flow Rate (L/min): 3 L/min  Recent Labs   Lab 03/04/20 1933 03/04/20 1933 03/04/20 2103 03/05/20 0106 03/05/20 0401 03/06/20 0227 03/06/20 0229 03/07/20 0442 03/08/20 0414 03/09/20 0045  WBC 5.1   < >  --   --  4.4 6.8  --  17.1* 16.6* 18.7*  HGB 16.3   < >  --   --  14.8 15.4  --  14.4 14.2 13.9  HCT 47.5   < >  --   --  40.0 42.6  --  39.9 38.3* 37.2*  PLT 161   < >  --   --  174 194  --  265 321 322  CRP 11.3*   < >  --   --  11.1* 9.6*  --  3.3* 1.7* 0.9  BNP  --   --   --   --   --   --  93.1 117.7* 149.8* 152.0*  DDIMER 1.92*   < >  --   --  2.00* 3.18*  --  1.28* 1.44* 1.42*  PROCALCITON 0.19  --   --   --  0.19  --   --  0.16 0.26 0.12  AST 69*   < >  --   --  59* 87*  --  71* 70* 86*  ALT 33   < >  --   --  30 40  --  40 43 63*  ALKPHOS 102   < >  --   --  88 93  --  78 86 85  BILITOT 1.0   < >  --   --  0.9 1.0  --  0.9 1.0 0.7  ALBUMIN 3.2*   < >  --   --  2.7* 2.6*  --  2.4* 2.6* 2.4*  LATICACIDVEN  --   --  1.7 1.7  --   --   --   --   --   --   SARSCOV2NAA POSITIVE*  --   --   --   --   --   --   --   --   --    < > = values in this interval not displayed.    2.  Essential hypertension.  Currently stable on Norvasc monitor and adjust.  3.  Dyslipidemia.  On statin.  4.  GERD.  On PPI.  5.  Hypokalemia and hyponatremia. Due to dehydration improved with IV fluids, potassium replaced again orally.  6.  Mildly elevated D-dimer due to intense inflammation.  Continue on moderate dose Lovenox, lower extremity venous duplex negative.  Will place him on aspirin for 2 weeks upon discharge.  7.  Borderline procalcitonin, some leukocytosis as well but I think that is due to steroid use.  Since he has received Actemra will place him on doxycycline for a total of 5 days.     Condition - Extremely Guarded  Family Communication  : Updated daughter Toniann Fail [phone] on 03/06/20  Code Status :  Full  Consults  :  None  Procedures  :   Leg [place] - No DVT  CTA -  No pulmonary embolism. Asymmetric bilateral  pulmonary infiltrate infiltrates in keeping with atypical infection and typical of that seen with acute COVID-19 pneumonia. Moderate to severe parenchymal involvement. Mild coronary artery calcification. Aortic Atherosclerosis (ICD10-I70.0).    PUD Prophylaxis : PPI  Disposition Plan  :    Status is: Inpatient  Remains inpatient appropriate because:IV treatments appropriate due to intensity of illness or inability to take PO   Dispo: The patient is from: Home              Anticipated d/c is to: Home              Anticipated d/c date is: > 3 days  Patient currently is not medically stable to d/c.   DVT Prophylaxis  :  Lovenox    Lab Results  Component Value Date   PLT 322 03/09/2020    Diet :  Diet Order            Diet Heart Room service appropriate? Yes; Fluid consistency: Thin  Diet effective now                  Inpatient Medications  Scheduled Meds: . amLODipine  10 mg Oral Daily  . vitamin C  500 mg Oral Daily  . atorvastatin  40 mg Oral QHS  . doxycycline  100 mg Oral Q12H  . enoxaparin (LOVENOX) injection  0.5 mg/kg Subcutaneous BID  . FLUoxetine  20 mg Oral Daily  . levothyroxine  100 mcg Oral QAC breakfast  . methylPREDNISolone (SOLU-MEDROL) injection  60 mg Intravenous Daily  . pantoprazole  40 mg Oral Q1200  . potassium chloride  40 mEq Oral BID  . zinc sulfate  220 mg Oral Daily   Continuous Infusions:  PRN Meds:.chlorpheniramine-HYDROcodone, guaiFENesin-dextromethorphan, hydrALAZINE  Antibiotics  :    Anti-infectives (From admission, onward)   Start     Dose/Rate Route Frequency Ordered Stop   03/09/20 1100  doxycycline (VIBRA-TABS) tablet 100 mg        100 mg Oral Every 12 hours 03/09/20 1008     03/05/20 1000  remdesivir 100 mg in sodium chloride 0.9 % 100 mL IVPB       "Followed by" Linked Group Details   100 mg 200 mL/hr over 30 Minutes Intravenous Daily 03/04/20 2305 03/08/20 1002   03/04/20 2315  remdesivir 200 mg in  sodium chloride 0.9% 250 mL IVPB       "Followed by" Linked Group Details   200 mg 580 mL/hr over 30 Minutes Intravenous Once 03/04/20 2305 03/05/20 0200       Time Spent in minutes  30   Susa Raring M.D on 03/09/2020 at 10:09 AM  To page go to [email or web address] - password [email or web address]  [facility] -  Office  [phone]   See all Orders from today for further details    Objective:   Vitals:   03/08/20 2358 03/09/20 0427 03/09/20 0700 03/09/20 0818  BP: 133/75 117/68 (!) 139/57 (!) 161/84  Pulse: 68 (!) 55 (!) 42 76  Resp: (!) 21   18  Temp: 98.6 F (37 C) 98.4 F (36.9 C) 98.2 F (36.8 C) 98.2 F (36.8 C)  TempSrc: Oral Oral Oral Oral  SpO2: 94% 95% 100% 91%  Weight:      Height:        Wt Readings from Last 3 Encounters:  03/04/20 99.8 kg     Intake/Output Summary (Last 24 hours) at 03/09/2020 1009 Last data filed at 03/09/2020 0914 Gross per 24 hour  Intake 460 ml  Output 500 ml  Net -40 ml     Physical Exam  Awake Alert, No new F.N deficits, Normal affect [place].AT,PERRAL Supple Neck,No JVD, No cervical lymphadenopathy appriciated.  Symmetrical Chest wall movement, Good air movement bilaterally, CTAB RRR,No Gallops, Rubs or new Murmurs, No Parasternal Heave +ve B.Sounds, Abd Soft, No tenderness, No organomegaly appriciated, No rebound - guarding or rigidity. No Cyanosis, Clubbing or edema, No new Rash or bruise    Data Review:    CBC Recent Labs  Lab 03/04/20 1933 03/04/20 1933 03/05/20 0401 03/06/20 0227 03/07/20 0442 03/08/20 0414 03/09/20 0045  WBC 5.1   < >  4.4 6.8 17.1* 16.6* 18.7*  HGB 16.3   < > 14.8 15.4 14.4 14.2 13.9  HCT 47.5   < > 40.0 42.6 39.9 38.3* 37.2*  PLT 161   < > 174 194 265 321 322  MCV 90.3   < > 86.6 85.7 85.3 85.7 84.7  MCH 31.0   < > 32.0 31.0 30.8 31.8 31.7  MCHC 34.3   < > 37.0* 36.2* 36.1* 37.1* 37.4*  RDW 11.4*   < > 11.5 11.5 11.6 11.7 11.7  LYMPHSABS 0.7  --  0.6*  --  1.3 0.0* 1.2  MONOABS 0.3  --  0.4   --  1.4* 0.7 1.2*  EOSABS 0.0  --  0.0  --  0.0 0.0 0.0  BASOSABS 0.1  --  0.0  --  0.1 0.0 0.0   < > = values in this interval not displayed.    Recent Labs  Lab 03/04/20 1933 03/04/20 1933 03/04/20 2103 03/05/20 0106 03/05/20 0401 03/06/20 0227 03/06/20 0229 03/07/20 0442 03/08/20 0414 03/09/20 0045  NA 127*   < >  --   --  127* 132*  --  131* 133* 135  K 3.3*   < >  --   --  3.3* 3.4*  --  3.2* 3.8 3.1*  CL 90*   < >  --   --  91* 96*  --  97* 100 99  CO2 22   < >  --   --  22 22  --  25 24 27   GLUCOSE 99   < >  --   --  136* 174*  --  156* 143* 121*  BUN 9   < >  --   --  11 16  --  18 20 17   CREATININE 1.12   < >  --   --  1.09 1.13  --  0.99 1.08 1.03  CALCIUM 9.1   < >  --   --  8.5* 9.1  --  8.5* 8.6* 8.5*  AST 69*   < >  --   --  59* 87*  --  71* 70* 86*  ALT 33   < >  --   --  30 40  --  40 43 63*  ALKPHOS 102   < >  --   --  88 93  --  78 86 85  BILITOT 1.0   < >  --   --  0.9 1.0  --  0.9 1.0 0.7  ALBUMIN 3.2*   < >  --   --  2.7* 2.6*  --  2.4* 2.6* 2.4*  MG  --   --   --   --   --   --   --  1.9 2.1 1.9  CRP 11.3*   < >  --   --  11.1* 9.6*  --  3.3* 1.7* 0.9  DDIMER 1.92*   < >  --   --  2.00* 3.18*  --  1.28* 1.44* 1.42*  PROCALCITON 0.19  --   --   --  0.19  --   --  0.16 0.26 0.12  LATICACIDVEN  --   --  1.7 1.7  --   --   --   --   --   --   BNP  --   --   --   --   --   --  93.1 117.7* 149.8* 152.0*   < > =  values in this interval not displayed.    ------------------------------------------------------------------------------------------------------------------ No results for input(s): CHOL, HDL, LDLCALC, TRIG, CHOLHDL, LDLDIRECT in the last 72 hours.  No results found for: HGBA1C ------------------------------------------------------------------------------------------------------------------ No results for input(s): TSH, T4TOTAL, T3FREE, THYROIDAB in the last 72 hours.  Invalid input(s): FREET3  Cardiac Enzymes No results for input(s): CKMB,  TROPONINI, MYOGLOBIN in the last 168 hours.  Invalid input(s): CK ------------------------------------------------------------------------------------------------------------------    Component Value Date/Time   BNP 152.0 (H) 03/09/2020 0045    Micro Results Recent Results (from the past 240 hour(s))  Resp Panel by RT-PCR (Flu A&B, Covid) Nasopharyngeal Swab     Status: Abnormal   Collection Time: 03/04/20  7:33 PM   Specimen: Nasopharyngeal Swab; Nasopharyngeal(NP) swabs in vial transport medium  Result Value Ref Range Status   SARS Coronavirus 2 by RT PCR POSITIVE (A) NEGATIVE Final    Comment: RESULT CALLED TO, READ BACK BY AND VERIFIED WITH: Costales Kathleen Argue RN 03/04/20 AT 2153 SK (NOTE) SARS-CoV-2 target nucleic acids are DETECTED.  The SARS-CoV-2 RNA is generally detectable in upper respiratory specimens during the acute phase of infection. Positive results are indicative of the presence of the identified virus, but do not rule out bacterial infection or co-infection with other pathogens not detected by the test. Clinical correlation with patient history and other diagnostic information is necessary to determine patient infection status. The expected result is Negative.  Fact Sheet for Patients: BloggerCourse.com  Fact Sheet for Healthcare Providers: SeriousBroker.it  This test is not yet approved or cleared by the [place] FDA and  has been authorized for detection and/or diagnosis of SARS-CoV-2 by FDA under an Emergency Use Authorization (EUA).  This EUA will remain in effect (meaning this te st can be used) for the duration of  the COVID-19 declaration under Section 564(b)(1) of the Act, 21 U.S.C. section 360bbb-3(b)(1), unless the authorization is terminated or revoked sooner.     Influenza A by PCR NEGATIVE NEGATIVE Final   Influenza B by PCR NEGATIVE NEGATIVE Final    Comment: (NOTE) The Xpert Xpress  SARS-CoV-2/FLU/RSV plus assay is intended as an aid in the diagnosis of influenza from Nasopharyngeal swab specimens and should not be used as a sole basis for treatment. Nasal washings and aspirates are unacceptable for Xpert Xpress SARS-CoV-2/FLU/RSV testing.  Fact Sheet for Patients: BloggerCourse.com  Fact Sheet for Healthcare Providers: SeriousBroker.it  This test is not yet approved or cleared by the [place] FDA and has been authorized for detection and/or diagnosis of SARS-CoV-2 by FDA under an Emergency Use Authorization (EUA). This EUA will remain in effect (meaning this test can be used) for the duration of the COVID-19 declaration under Section 564(b)(1) of the Act, 21 U.S.C. section 360bbb-3(b)(1), unless the authorization is terminated or revoked.  Performed at [facility], [address] [place], [place] [address]   Blood Culture (routine x 2)     Status: None   Collection Time: 03/04/20  8:45 PM   Specimen: BLOOD LEFT HAND  Result Value Ref Range Status   Specimen Description BLOOD LEFT HAND  Final   Special Requests   Final    AEROBIC BOTTLE ONLY Blood Culture results may not be optimal due to an inadequate volume of blood received in culture bottles   Culture   Final    NO GROWTH 5 DAYS Performed at [facility], [address], [place], [place] [address]    Report Status 03/09/2020 FINAL  Final  Blood Culture (routine x 2)     Status: None (Preliminary result)   Collection Time: 03/05/20 12:16 AM   Specimen: BLOOD LEFT FOREARM  Result Value Ref Range Status   Specimen Description BLOOD LEFT FOREARM  Final   Special Requests   Final    BOTTLES DRAWN AEROBIC AND ANAEROBIC Blood Culture adequate volume   Culture   Final    NO GROWTH 4 DAYS Performed at [facility], [address] [place], [place] [address]    Report Status PENDING  Incomplete  MRSA PCR Screening     Status: None    Collection Time: 03/06/20  2:36 PM   Specimen: Nasal Mucosa; Nasopharyngeal  Result Value Ref Range Status   MRSA by PCR NEGATIVE NEGATIVE Final    Comment:        The GeneXpert MRSA Assay (FDA approved for NASAL specimens only), is one component of a comprehensive MRSA colonization surveillance program. It is not intended to diagnose MRSA infection nor to guide or monitor treatment for MRSA infections. Performed at [facility], [address] [place], [place] [address]     Radiology Reports CT ANGIO CHEST PE W OR WO CONTRAST  Result Date: 03/05/2020 CLINICAL DATA:  Pulmonary embolism, dyspnea, COVID pneumonia EXAM: CT ANGIOGRAPHY CHEST WITH CONTRAST TECHNIQUE: Multidetector CT imaging of the chest was performed using the standard protocol during bolus administration of intravenous contrast. Multiplanar CT image reconstructions and MIPs were obtained to evaluate the vascular anatomy. CONTRAST:  60mL OMNIPAQUE IOHEXOL 350 MG/ML SOLN COMPARISON:  None. FINDINGS: Cardiovascular: There is adequate opacification of the pulmonary arterial tree. No intraluminal filling defect identified to suggest acute pulmonary embolism. The pulmonary arteries are of normal caliber. Mild coronary artery calcification. Global cardiac size within normal limits. No pericardial effusion. Thoracic aorta demonstrates mild atherosclerotic calcification within the arch. No aortic aneurysm. Mediastinum/Nodes: No enlarged mediastinal, hilar, or axillary lymph nodes. Thyroid gland, trachea, and esophagus demonstrate no significant findings. Small hiatal hernia noted. Lungs/Pleura: There is extensive bilateral asymmetric diffuse ground-glass pulmonary infiltrate and peripheral areas of consolidation in keeping with changes of atypical infection in the acute setting and typical of that seen with COVID-19 pneumonia. There is moderate to severe parenchymal involvement. No pneumothorax or pleural effusion. No central  obstructing lesion. Upper Abdomen: No acute abnormality. Musculoskeletal: No chest wall abnormality. No acute or significant osseous findings. Review of the MIP images confirms the above findings. IMPRESSION: No pulmonary embolism. Asymmetric bilateral pulmonary infiltrate infiltrates in keeping with atypical infection and typical of that seen with acute COVID-19 pneumonia. Moderate to severe parenchymal involvement. Mild coronary artery calcification. Aortic Atherosclerosis (ICD10-I70.0). Electronically Signed   By: Helyn Numbers MD   On: 03/05/2020 06:57   DG Chest Port 1 View  Result Date: 03/06/2020 CLINICAL DATA:  Shortness of breath.  COVID-19 positive. EXAM: PORTABLE CHEST 1 VIEW COMPARISON:  03/04/2020 and chest CT 03/05/2020 FINDINGS: Parenchymal densities in the right upper lung have progressed since 03/04/2020. Findings are similar to the recent CT findings. Persistent patchy densities in the mid and lower left lung. Heart size is grossly stable. Negative for pneumothorax. IMPRESSION: Bilateral parenchymal densities are compatible with bilateral pneumonia. Progression of disease since 03/04/2020. The distribution of disease is compatible with COVID-19 infection. Electronically Signed   By: Richarda Overlie M.D.   On: 03/06/2020 08:52   DG Chest Port 1 View  Result Date: 03/04/2020 CLINICAL DATA:  Shortness of breath for 3 days. History of COVID-19 exposure.  EXAM: PORTABLE CHEST 1 VIEW COMPARISON:  None. FINDINGS: The patient has bilateral airspace disease with a peripheral predominance. Heart size is normal. No pneumothorax or pleural effusion. IMPRESSION: Bilateral pneumonia with an appearance compatible with COVID-19 infection. Electronically Signed   By: Drusilla Kanner M.D.   On: 03/04/2020 20:32   VAS [place] LOWER EXTREMITY VENOUS (DVT)  Result Date: 03/06/2020  Lower Venous DVT Study Indications: Elevated Ddimer.  Risk Factors: COVID 19 positive. Comparison Study: No prior studies. Performing  Technologist: Chanda Busing RVT  Examination Guidelines: A complete evaluation includes B-mode imaging, spectral Doppler, color Doppler, and power Doppler as needed of all accessible portions of each vessel. Bilateral testing is considered an integral part of a complete examination. Limited examinations for reoccurring indications may be performed as noted. The reflux portion of the exam is performed with the patient in reverse Trendelenburg.  +---------+---------------+---------+-----------+----------+--------------+ RIGHT    CompressibilityPhasicitySpontaneityPropertiesThrombus Aging +---------+---------------+---------+-----------+----------+--------------+ CFV      Full           Yes      Yes                                 +---------+---------------+---------+-----------+----------+--------------+ SFJ      Full                                                        +---------+---------------+---------+-----------+----------+--------------+ FV Prox  Full                                                        +---------+---------------+---------+-----------+----------+--------------+ FV Mid   Full                                                        +---------+---------------+---------+-----------+----------+--------------+ FV DistalFull                                                        +---------+---------------+---------+-----------+----------+--------------+ PFV      Full                                                        +---------+---------------+---------+-----------+----------+--------------+ POP      Full           Yes      Yes                                 +---------+---------------+---------+-----------+----------+--------------+ PTV      Full                                                        +---------+---------------+---------+-----------+----------+--------------+  PERO     Full                                                         +---------+---------------+---------+-----------+----------+--------------+   +---------+---------------+---------+-----------+----------+--------------+ LEFT     CompressibilityPhasicitySpontaneityPropertiesThrombus Aging +---------+---------------+---------+-----------+----------+--------------+ CFV      Full           Yes      Yes                                 +---------+---------------+---------+-----------+----------+--------------+ SFJ      Full                                                        +---------+---------------+---------+-----------+----------+--------------+ FV Prox  Full                                                        +---------+---------------+---------+-----------+----------+--------------+ FV Mid   Full                                                        +---------+---------------+---------+-----------+----------+--------------+ FV DistalFull                                                        +---------+---------------+---------+-----------+----------+--------------+ PFV      Full                                                        +---------+---------------+---------+-----------+----------+--------------+ POP      Full           Yes      Yes                                 +---------+---------------+---------+-----------+----------+--------------+ PTV      Full                                                        +---------+---------------+---------+-----------+----------+--------------+ PERO     Full                                                        +---------+---------------+---------+-----------+----------+--------------+  Summary: RIGHT: - There is no evidence of deep vein thrombosis in the lower extremity.  - No cystic structure found in the popliteal fossa.  LEFT: - There is no evidence of deep vein thrombosis in the lower extremity.  - No cystic structure found in  the popliteal fossa.  *See table(s) above for measurements and observations. Electronically signed by Coral ElseVance Brabham MD on 03/06/2020 at 5:34:02 PM.    Final

## 2020-03-09 NOTE — Progress Notes (Signed)
Physical Therapy Treatment Patient Details Name: Adam Edwards MRN: [id number] DOB: [birth year] Today's Date: 03/09/2020    History of Present Illness Pt is a 74 y.o.malewithhistory of hypertension presents to the ER d/t worsening shortness of breath for approx 3 days prior, in the ER he was diagnosed with severe acute hypoxic respiratory failure due to COVID-19 pneumonia and admitted to the hospital.    PT Comments    Continuing work on functional mobility and activity tolerance;  Session focused on response and O2 needs with increasing activity/progressive amb; Notable less O2 requirement with hallway amb than PT eval 2 days ago, when he needed 8L supplemental O2 via HFNC; Started amb on 3 L and titrated up to 4 L with hallway amb today; Pt shows excellent self-monitor and controlled breathing; on track for dc home tomorrow with supplemental O2   Follow Up Recommendations  No PT follow up     Equipment Recommendations  Other (comment) (Oxygen)    Recommendations for Other Services       Precautions / Restrictions Precautions Precautions: Other (comment) Precaution Comments: watch O2    Mobility  Bed Mobility               General bed mobility comments: in recliner upon arrival  Transfers Overall transfer level: Needs assistance Equipment used: None Transfers: Sit to/from Stand Sit to Stand: Supervision         General transfer comment: Cues to self-monitor for activity tolerance  Ambulation/Gait Ambulation/Gait assistance: Supervision Gait Distance (Feet): 180 Feet Assistive device: None (and pushed the O2 tank last 20 or so feet in room) Gait Pattern/deviations: Step-through pattern     General Gait Details: Cues ot self-monitor for activity tolerance; very slow pace, with apparent intense focus on controlled breathing; Walked on 4L supplemental O2, and O2 sats renaged 85%-91% throughout walk   Stairs             Wheelchair Mobility     Modified Rankin (Stroke Patients Only)       Balance Overall balance assessment: No apparent balance deficits (not formally assessed)                                          Cognition Arousal/Alertness: Awake/alert Behavior During Therapy: WFL for tasks assessed/performed Overall Cognitive Status: Within Functional Limits for tasks assessed                                        Exercises      General Comments General comments (skin integrity, edema, etc.): Performed incentive spirometry      Pertinent Vitals/Pain Pain Assessment: No/denies pain    Home Living                      Prior Function            PT Goals (current goals can now be found in the care plan section) Acute Rehab PT Goals Patient Stated Goal: home, back to PLOF (independent) PT Goal Formulation: With patient Time For Goal Achievement: 03/21/20 Potential to Achieve Goals: Good Progress towards PT goals: Progressing toward goals    Frequency    Min 3X/week      PT Plan Current plan remains appropriate    Co-evaluation  AM-PAC PT "6 Clicks" Mobility   Outcome Measure  Help needed turning from your back to your side while in a flat bed without using bedrails?: None Help needed moving from lying on your back to sitting on the side of a flat bed without using bedrails?: None Help needed moving to and from a bed to a chair (including a wheelchair)?: None Help needed standing up from a chair using your arms (e.g., wheelchair or bedside chair)?: None Help needed to walk in hospital room?: None Help needed climbing 3-5 steps with a railing? : A Little 6 Click Score: 23    End of Session Equipment Utilized During Treatment: Oxygen Activity Tolerance: Patient tolerated treatment well Patient left: in chair;with call bell/phone within reach Nurse Communication: Mobility status PT Visit Diagnosis: Other abnormalities of gait and  mobility (R26.89)     Time: 1025-1050 PT Time Calculation (min) (ACUTE ONLY): 25 min  Charges:  $Gait Training: 23-37 mins                     Van Clines, [place]  Acute Rehabilitation Services Pager [phone] Office [phone]    Levi Aland 03/09/2020, 2:11 PM

## 2020-03-09 NOTE — Plan of Care (Signed)
Patient is currently resting in bed. OOB bed standby to [facility]. VSS. Currently on 3L [place]. Call bell within reach.   Problem: Education: Goal: Knowledge of risk factors and measures for prevention of condition will improve Outcome: Progressing   Problem: Coping: Goal: Psychosocial and spiritual needs will be supported Outcome: Progressing   Problem: Respiratory: Goal: Will maintain a patent airway Outcome: Progressing Goal: Complications related to the disease process, condition or treatment will be avoided or minimized Outcome: Progressing

## 2020-03-10 LAB — CULTURE, BLOOD (ROUTINE X 2)
Culture: NO GROWTH
Special Requests: ADEQUATE

## 2020-03-10 LAB — COMPREHENSIVE METABOLIC PANEL
ALT: 111 U/L — ABNORMAL HIGH (ref 0–44)
AST: 101 U/L — ABNORMAL HIGH (ref 15–41)
Albumin: 2.6 g/dL — ABNORMAL LOW (ref 3.5–5.0)
Alkaline Phosphatase: 83 U/L (ref 38–126)
Anion gap: 10 (ref 5–15)
BUN: 17 mg/dL (ref 8–23)
CO2: 26 mmol/L (ref 22–32)
Calcium: 8.7 mg/dL — ABNORMAL LOW (ref 8.9–10.3)
Chloride: 97 mmol/L — ABNORMAL LOW (ref 98–111)
Creatinine, Ser: 1.01 mg/dL (ref 0.61–1.24)
GFR, Estimated: >60 mL/min (ref >60)
Glucose, Bld: 129 mg/dL — ABNORMAL HIGH (ref 70–99)
Potassium: 3.8 mmol/L (ref 3.5–5.1)
Sodium: 133 mmol/L — ABNORMAL LOW (ref 135–145)
Total Bilirubin: 1.2 mg/dL (ref 0.3–1.2)
Total Protein: 5.6 g/dL — ABNORMAL LOW (ref 6.5–8.1)

## 2020-03-10 LAB — CBC WITH DIFFERENTIAL/PLATELET
Abs Immature Granulocytes: 0.19 10*3/uL — ABNORMAL HIGH (ref 0.00–0.07)
Basophils Absolute: 0 10*3/uL (ref 0.0–0.1)
Basophils Relative: 0 %
Eosinophils Absolute: 0 10*3/uL (ref 0.0–0.5)
Eosinophils Relative: 0 %
HCT: 40.4 % (ref 39.0–52.0)
Hemoglobin: 15 g/dL (ref 13.0–17.0)
Immature Granulocytes: 1 %
Lymphocytes Relative: 7 %
Lymphs Abs: 1.1 10*3/uL (ref 0.7–4.0)
MCH: 31.4 pg (ref 26.0–34.0)
MCHC: 37.1 g/dL — ABNORMAL HIGH (ref 30.0–36.0)
MCV: 84.5 fL (ref 80.0–100.0)
Monocytes Absolute: 0.7 10*3/uL (ref 0.1–1.0)
Monocytes Relative: 4 %
Neutro Abs: 13.5 10*3/uL — ABNORMAL HIGH (ref 1.7–7.7)
Neutrophils Relative %: 88 %
Platelets: 347 10*3/uL (ref 150–400)
RBC: 4.78 MIL/uL (ref 4.22–5.81)
RDW: 11.6 % (ref 11.5–15.5)
WBC: 15.6 10*3/uL — ABNORMAL HIGH (ref 4.0–10.5)
nRBC: 0 % (ref 0.0–0.2)

## 2020-03-10 LAB — D-DIMER, QUANTITATIVE: D-Dimer, Quant: 1.48 ug/mL-FEU — ABNORMAL HIGH (ref 0.00–0.50)

## 2020-03-10 LAB — BRAIN NATRIURETIC PEPTIDE: B Natriuretic Peptide: 142.4 pg/mL — ABNORMAL HIGH (ref 0.0–100.0)

## 2020-03-10 LAB — MAGNESIUM: Magnesium: 2.1 mg/dL (ref 1.7–2.4)

## 2020-03-10 LAB — C-REACTIVE PROTEIN: CRP: 0.6 mg/dL (ref <1.0)

## 2020-03-10 LAB — PROCALCITONIN: Procalcitonin: 0.16 ng/mL

## 2020-03-10 MED ORDER — PANTOPRAZOLE SODIUM 40 MG PO TBEC
40.0000 mg | DELAYED_RELEASE_TABLET | Freq: Every day | ORAL | 0 refills | Status: DC
Start: 2020-03-10 — End: 2023-10-14

## 2020-03-10 MED ORDER — METHYLPREDNISOLONE 4 MG PO TBPK: ORAL_TABLET | ORAL | 0 refills | Status: DC

## 2020-03-10 MED ORDER — APIXABAN 2.5 MG PO TABS
2.5000 mg | ORAL_TABLET | Freq: Two times a day (BID) | ORAL | 0 refills | Status: DC
Start: 2020-03-10 — End: 2023-10-14

## 2020-03-10 MED ORDER — APIXABAN 2.5 MG PO TABS
2.5000 mg | ORAL_TABLET | Freq: Two times a day (BID) | ORAL | Status: DC
  Administered 2020-03-10 (×2): 2.5 mg via ORAL
  Filled 2020-03-10 (×2): qty 1

## 2020-03-10 MED ORDER — ALBUTEROL SULFATE HFA 108 (90 BASE) MCG/ACT IN AERS: 2.0000 | INHALATION_SPRAY | Freq: Four times a day (QID) | RESPIRATORY_TRACT | 0 refills | Status: AC | PRN

## 2020-03-10 MED ORDER — DOXYCYCLINE HYCLATE 100 MG PO TABS
100.0000 mg | ORAL_TABLET | Freq: Two times a day (BID) | ORAL | 0 refills | Status: DC
Start: 2020-03-10 — End: 2023-10-14

## 2020-03-10 NOTE — Progress Notes (Signed)
Patient discharging home with daughter. Vital signs stable at time of discharge as reflected in discharge summary. Educated patient and daughter on discharge instructions and verbal understanding received. Patient to pick up prescriptions and patient discharging with eliquis coupon. No questions at this time.

## 2020-03-10 NOTE — Progress Notes (Signed)
Occupational Therapy Treatment Patient Details Name: Adam Edwards MRN: [id number] DOB: 02-21-1946 Today's Date: 03/10/2020    History of present illness Pt is a 74 y.o. male with history of hypertension presents to the ER 2/2 worsening shortness of breath for approx 3 days prior, in the ER he was diagnosed with severe acute hypoxic respiratory failure due to COVID-19 pneumonia and admitted to the hospital.   OT comments  Pt seen for OT follow up session with focus on ADL mobility progression and energy conservation training. Pt presents up in recliner, on 2L [place]. He currently requires 2-3L [place] to maintain SpO2 >85% with functional mobility. Educated pt on how to acquire pulse ox for self O2 management in home. Also discussed safe energy conservation strategies as it applies to pts typical ADL/IADL routine. Pt in understanding of how to acquire a shower seat. Reviewed incentive spirometer and flutter usage, pt in understanding. Also discussed safe community reintegration with current need to quarantine. He is hopeful for d/c home today, but will continue to follow per POC listed below as long as acutely admitted.   Follow Up Recommendations  No OT follow up;Supervision - Intermittent    Equipment Recommendations  Tub/shower seat    Recommendations for Other Services      Precautions / Restrictions Precautions Precautions: None       Mobility Bed Mobility               General bed mobility comments: up in chair, returned to chair  Transfers Overall transfer level: Needs assistance Equipment used: None Transfers: Sit to/from Stand Sit to Stand: Supervision         General transfer comment: Cues to self-monitor for activity tolerance    Balance Overall balance assessment: No apparent balance deficits (not formally assessed)                                         ADL either performed or assessed with clinical judgement   ADL Overall ADL's : Needs  assistance/impaired                                       General ADL Comments: Pt demonstrates ability to complete all ADL activity at mod I level. Reviewed energy conservation techniques to implement into maintain activity tolerance for multi step tasks     Vision Patient Visual Report: No change from baseline     Perception     Praxis      Cognition Arousal/Alertness: Awake/alert Behavior During Therapy: WFL for tasks assessed/performed Overall Cognitive Status: Within Functional Limits for tasks assessed                                          Exercises     Shoulder Instructions       General Comments      Pertinent Vitals/ Pain       Pain Assessment: No/denies pain  Home Living                                          Prior Functioning/Environment  Frequency  Min 2X/week        Progress Toward Goals  OT Goals(current goals can now be found in the care plan section)  Progress towards OT goals: Progressing toward goals  Acute Rehab OT Goals Patient Stated Goal: home, back to PLOF (independent) OT Goal Formulation: With patient Time For Goal Achievement: 03/21/20 Potential to Achieve Goals: Good  Plan Discharge plan remains appropriate    Co-evaluation                 AM-PAC OT "6 Clicks" Daily Activity     Outcome Measure   Help from another person eating meals?: None Help from another person taking care of personal grooming?: None Help from another person toileting, which includes using toliet, bedpan, or urinal?: None Help from another person bathing (including washing, rinsing, drying)?: None Help from another person to put on and taking off regular upper body clothing?: None Help from another person to put on and taking off regular lower body clothing?: None 6 Click Score: 24    End of Session Equipment Utilized During Treatment: Oxygen  OT Visit Diagnosis:  Muscle weakness (generalized) (M62.81);Other abnormalities of gait and mobility (R26.89)   Activity Tolerance Patient tolerated treatment well   Patient Left in chair;with call bell/phone within reach   Nurse Communication Mobility status        Time: 3825-0539 OT Time Calculation (min): 12 min  Charges: OT General Charges $OT Visit: 1 Visit OT Treatments $Self Care/Home Management : 8-22 mins  Dalphine Handing, MSOT, OTR/L Acute Rehabilitation Services [facility] Office Number: [phone] Pager: [phone]  Dalphine Handing 03/10/2020, 3:06 PM

## 2020-03-10 NOTE — Discharge Summary (Signed)
Grason Brailsford WUJ:[id number] DOB: 10-27-1945 DOA: 03/04/2020  PCP: Dennard Schaumann, MD  Admit date: 03/04/2020  Discharge date: 03/10/2020  Admitted From: Home  Disposition:  Home   Recommendations for Outpatient Follow-up:   Follow up with PCP in 1-2 weeks  PCP Please obtain BMP/CBC, 2 view CXR in 1week,  (see Discharge instructions)   PCP Please follow up on the following pending results: Check CBC, CMP and a two-view chest x-ray in 7 to 10 days.   Home Health:  None Equipment/Devices: 3lit o2  Consultations: None  Discharge Condition: Stable    CODE STATUS: Full    Diet Recommendation: Heart Healthy   Diet Order            Diet - low sodium heart healthy           Diet Heart Room service appropriate? Yes; Fluid consistency: Thin  Diet effective now                  Chief Complaint  Patient presents with  . Covid Exposure  . Shortness of Breath     Brief history of present illness from the day of admission and additional interim summary    Adam Edwards a 74 y.o.malewithhistory of hypertension presents to the ER because of worsening shortness of breath for the last 3 days prior to hospital visit, in the ER he was diagnosed with severe acute hypoxic respiratory failure due to COVID-19 pneumonia and admitted to the hospital.                                                                 Hospital Course      1. Acute Hypoxic Resp. Failure due to Acute Covid 19 Viral Pneumonitis during the ongoing 2020 Covid 19 Pandemic - he is unfortunately unvaccinated and had incurred severe parenchymal lung injury due to COVID-19 pneumonia, he was treated with IV steroids, Remdesivir and Actemra combination.  Shown remarkable improvement and from 15 L nasal cannula oxygen he is on 2 L nasal cannula oxygen  at rest.  Symptom-free.  Be discharged home on home oxygen, Medrol Dosepak along with rescue inhaler with PCP follow-up in a week.   Recent Labs  Lab 03/04/20 1933 03/04/20 1933 03/04/20 2103 03/05/20 0106 03/05/20 0401 03/05/20 0401 03/06/20 0227 03/06/20 0229 03/07/20 0442 03/08/20 0414 03/09/20 0045 03/10/20 0127  WBC 5.1   < >  --   --  4.4   < > 6.8  --  17.1* 16.6* 18.7* 15.6*  CRP 11.3*   < >  --   --  11.1*   < > 9.6*  --  3.3* 1.7* 0.9 0.6  DDIMER 1.92*   < >  --   --  2.00*   < > 3.18*  --  1.28* 1.44*  1.42* 1.48*  BNP  --   --   --   --   --   --   --  93.1 117.7* 149.8* 152.0* 142.4*  PROCALCITON 0.19   < >  --   --  0.19  --   --   --  0.16 0.26 0.12 0.16  LATICACIDVEN  --   --  1.7 1.7  --   --   --   --   --   --   --   --   AST 69*   < >  --   --  59*   < > 87*  --  71* 70* 86* 101*  ALT 33   < >  --   --  30   < > 40  --  40 43 63* 111*  ALKPHOS 102   < >  --   --  88   < > 93  --  78 86 85 83  BILITOT 1.0   < >  --   --  0.9   < > 1.0  --  0.9 1.0 0.7 1.2  ALBUMIN 3.2*   < >  --   --  2.7*   < > 2.6*  --  2.4* 2.6* 2.4* 2.6*  SARSCOV2NAA POSITIVE*  --   --   --   --   --   --   --   --   --   --   --    < > = values in this interval not displayed.      2.  Essential hypertension.  Currently stable on Norvasc monitor and adjust.  3.  Dyslipidemia.  On statin.  4.  GERD.  On PPI.  5.  Hypokalemia and hyponatremia. Due to dehydration improved with IV fluids, PCP to repeat CMP in a week.  6.  Mildly elevated D-dimer due to intense inflammation.  Continue on moderate dose Lovenox, lower extremity venous duplex negative.  Will place him on Eliquis for 2 weeks upon discharge.  7.  Borderline procalcitonin, some leukocytosis as well but I think that is due to steroid use.  Since he has received Actemra will place him on doxycycline for a total of 5 days.   Discharge diagnosis     Principal Problem:   COVID-19 Active Problems:   Acute respiratory  failure (HCC)   Essential hypertension    Discharge instructions    Discharge Instructions    Diet - low sodium heart healthy   Complete by: As directed    Discharge instructions   Complete by: As directed    Follow with Primary MD Dennard Schaumann, MD in 7 days   Get CBC, CMP, 2 view Chest X ray -  checked next visit within 1 week by Primary MD    Activity: As tolerated with Full fall precautions use walker/cane & assistance as needed  Disposition Home   Diet: Heart Healthy   Special Instructions: If you have smoked or chewed Tobacco  in the last 2 yrs please stop smoking, stop any regular Alcohol  and or any Recreational drug use.  On your next visit with your primary care physician please Get Medicines reviewed and adjusted.  Please request your Prim.MD to go over all Hospital Tests and Procedure/Radiological results at the follow up, please get all Hospital records sent to your Prim MD by signing hospital release before you go home.  If you experience worsening of your admission symptoms, develop shortness  of breath, life threatening emergency, suicidal or homicidal thoughts you must seek medical attention immediately by calling 911 or calling your MD immediately  if symptoms less severe.  You Must read complete instructions/literature along with all the possible adverse reactions/side effects for all the Medicines you take and that have been prescribed to you. Take any new Medicines after you have completely understood and accpet all the possible adverse reactions/side effects.   Increase activity slowly   Complete by: As directed    MyChart COVID-19 home monitoring program   Complete by: Mar 10, 2020    Is the patient willing to use the MyChart Mobile App for home monitoring?: Yes   Temperature monitoring   Complete by: Mar 10, 2020    After how many days would you like to receive a notification of this patient's flowsheet entries?: 1      Discharge Medications    Allergies as of 03/10/2020   No Known Allergies     Medication List    TAKE these medications   albuterol 108 (90 Base) MCG/ACT inhaler Commonly known as: VENTOLIN HFA Inhale 2 puffs into the lungs every 6 (six) hours as needed for wheezing or shortness of breath.   amLODipine 10 MG tablet Commonly known as: NORVASC Take 10 mg by mouth daily.   apixaban 2.5 MG Tabs tablet Commonly known as: ELIQUIS Take 1 tablet (2.5 mg total) by mouth 2 (two) times daily.   atorvastatin 40 MG tablet Commonly known as: LIPITOR Take 40 mg by mouth at bedtime.   chlorthalidone 25 MG tablet Commonly known as: HYGROTON Take 12.5 mg by mouth daily.   doxycycline 100 MG tablet Commonly known as: VIBRA-TABS Take 1 tablet (100 mg total) by mouth every 12 (twelve) hours.   Fish Oil 1000 MG Caps Take 2,000 mg by mouth in the morning and at bedtime.   FLUoxetine 20 MG capsule Commonly known as: PROZAC Take 20 mg by mouth daily.   hydrOXYzine 25 MG capsule Commonly known as: VISTARIL Take 25 mg by mouth daily as needed for itching.   levothyroxine 100 MCG tablet Commonly known as: SYNTHROID Take 100 mcg by mouth daily before breakfast.   methylPREDNISolone 4 MG Tbpk tablet Commonly known as: MEDROL DOSEPAK follow package directions   omeprazole 20 MG capsule Commonly known as: PRILOSEC Take 20 mg by mouth daily as needed (acid reflux). Take on empty stomach 30 minutes prior to a meal   pantoprazole 40 MG tablet Commonly known as: PROTONIX Take 1 tablet (40 mg total) by mouth daily at 12 noon.   potassium chloride SA 20 MEQ tablet Commonly known as: KLOR-CON Take 20 mEq by mouth 3 (three) times daily with meals.            Durable Medical Equipment  (From admission, onward)         Start     Ordered   03/09/20 1013  For home use only DME oxygen  Once       Question Answer Comment  Length of Need 6 Months   Mode or (Route) Nasal cannula   Liters per Minute 3    Frequency Continuous (stationary and portable oxygen unit needed)   Oxygen conserving device Yes   Oxygen delivery system Gas      03/09/20 1012           Follow-up Information    Dennard Schaumann, MD. Schedule an appointment as soon as possible for a visit in 1 week(s).  Specialty: Internal Medicine Contact information: [address] [place] [address] [phone]               Major procedures and Radiology Reports - PLEASE review detailed and final reports thoroughly  -      CT ANGIO CHEST PE W OR WO CONTRAST  Result Date: 03/05/2020 CLINICAL DATA:  Pulmonary embolism, dyspnea, COVID pneumonia EXAM: CT ANGIOGRAPHY CHEST WITH CONTRAST TECHNIQUE: Multidetector CT imaging of the chest was performed using the standard protocol during bolus administration of intravenous contrast. Multiplanar CT image reconstructions and MIPs were obtained to evaluate the vascular anatomy. CONTRAST:  58mL OMNIPAQUE IOHEXOL 350 MG/ML SOLN COMPARISON:  None. FINDINGS: Cardiovascular: There is adequate opacification of the pulmonary arterial tree. No intraluminal filling defect identified to suggest acute pulmonary embolism. The pulmonary arteries are of normal caliber. Mild coronary artery calcification. Global cardiac size within normal limits. No pericardial effusion. Thoracic aorta demonstrates mild atherosclerotic calcification within the arch. No aortic aneurysm. Mediastinum/Nodes: No enlarged mediastinal, hilar, or axillary lymph nodes. Thyroid gland, trachea, and esophagus demonstrate no significant findings. Small hiatal hernia noted. Lungs/Pleura: There is extensive bilateral asymmetric diffuse ground-glass pulmonary infiltrate and peripheral areas of consolidation in keeping with changes of atypical infection in the acute setting and typical of that seen with COVID-19 pneumonia. There is moderate to severe parenchymal involvement. No pneumothorax or pleural effusion. No central  obstructing lesion. Upper Abdomen: No acute abnormality. Musculoskeletal: No chest wall abnormality. No acute or significant osseous findings. Review of the MIP images confirms the above findings. IMPRESSION: No pulmonary embolism. Asymmetric bilateral pulmonary infiltrate infiltrates in keeping with atypical infection and typical of that seen with acute COVID-19 pneumonia. Moderate to severe parenchymal involvement. Mild coronary artery calcification. Aortic Atherosclerosis (ICD10-I70.0). Electronically Signed   By: Helyn Numbers MD   On: 03/05/2020 06:57   DG Chest Port 1 View  Result Date: 03/09/2020 CLINICAL DATA:  Shortness of breath, COVID EXAM: PORTABLE CHEST 1 VIEW COMPARISON:  03/06/2020 FINDINGS: Patchy bilateral airspace opacities, improved on the right since prior study, stable on the left. Heart is normal size. No effusions or pneumothorax. No acute bony abnormality. IMPRESSION: Patchy bilateral airspace disease with some improvement on the right since prior study. Electronically Signed   By: Charlett Nose M.D.   On: 03/09/2020 11:59   DG Chest Port 1 View  Result Date: 03/06/2020 CLINICAL DATA:  Shortness of breath.  COVID-19 positive. EXAM: PORTABLE CHEST 1 VIEW COMPARISON:  03/04/2020 and chest CT 03/05/2020 FINDINGS: Parenchymal densities in the right upper lung have progressed since 03/04/2020. Findings are similar to the recent CT findings. Persistent patchy densities in the mid and lower left lung. Heart size is grossly stable. Negative for pneumothorax. IMPRESSION: Bilateral parenchymal densities are compatible with bilateral pneumonia. Progression of disease since 03/04/2020. The distribution of disease is compatible with COVID-19 infection. Electronically Signed   By: Richarda Overlie M.D.   On: 03/06/2020 08:52   DG Chest Port 1 View  Result Date: 03/04/2020 CLINICAL DATA:  Shortness of breath for 3 days. History of COVID-19 exposure. EXAM: PORTABLE CHEST 1 VIEW COMPARISON:  None.  FINDINGS: The patient has bilateral airspace disease with a peripheral predominance. Heart size is normal. No pneumothorax or pleural effusion. IMPRESSION: Bilateral pneumonia with an appearance compatible with COVID-19 infection. Electronically Signed   By: Drusilla Kanner M.D.   On: 03/04/2020 20:32   VAS [place] LOWER EXTREMITY VENOUS (DVT)  Result Date: 03/06/2020  Lower Venous DVT Study  Indications: Elevated Ddimer.  Risk Factors: COVID 19 positive. Comparison Study: No prior studies. Performing Technologist: Chanda BusingGregory Collins RVT  Examination Guidelines: A complete evaluation includes B-mode imaging, spectral Doppler, color Doppler, and power Doppler as needed of all accessible portions of each vessel. Bilateral testing is considered an integral part of a complete examination. Limited examinations for reoccurring indications may be performed as noted. The reflux portion of the exam is performed with the patient in reverse Trendelenburg.  +---------+---------------+---------+-----------+----------+--------------+ RIGHT    CompressibilityPhasicitySpontaneityPropertiesThrombus Aging +---------+---------------+---------+-----------+----------+--------------+ CFV      Full           Yes      Yes                                 +---------+---------------+---------+-----------+----------+--------------+ SFJ      Full                                                        +---------+---------------+---------+-----------+----------+--------------+ FV Prox  Full                                                        +---------+---------------+---------+-----------+----------+--------------+ FV Mid   Full                                                        +---------+---------------+---------+-----------+----------+--------------+ FV DistalFull                                                        +---------+---------------+---------+-----------+----------+--------------+ PFV       Full                                                        +---------+---------------+---------+-----------+----------+--------------+ POP      Full           Yes      Yes                                 +---------+---------------+---------+-----------+----------+--------------+ PTV      Full                                                        +---------+---------------+---------+-----------+----------+--------------+ PERO     Full                                                        +---------+---------------+---------+-----------+----------+--------------+   +---------+---------------+---------+-----------+----------+--------------+  LEFT     CompressibilityPhasicitySpontaneityPropertiesThrombus Aging +---------+---------------+---------+-----------+----------+--------------+ CFV      Full           Yes      Yes                                 +---------+---------------+---------+-----------+----------+--------------+ SFJ      Full                                                        +---------+---------------+---------+-----------+----------+--------------+ FV Prox  Full                                                        +---------+---------------+---------+-----------+----------+--------------+ FV Mid   Full                                                        +---------+---------------+---------+-----------+----------+--------------+ FV DistalFull                                                        +---------+---------------+---------+-----------+----------+--------------+ PFV      Full                                                        +---------+---------------+---------+-----------+----------+--------------+ POP      Full           Yes      Yes                                 +---------+---------------+---------+-----------+----------+--------------+ PTV      Full                                                         +---------+---------------+---------+-----------+----------+--------------+ PERO     Full                                                        +---------+---------------+---------+-----------+----------+--------------+     Summary: RIGHT: - There is no evidence of deep vein thrombosis in the lower extremity.  - No cystic structure found in the popliteal fossa.  LEFT: - There is no evidence of deep vein thrombosis in the lower extremity.  - No  cystic structure found in the popliteal fossa.  *See table(s) above for measurements and observations. Electronically signed by Coral Else MD on 03/06/2020 at 5:34:02 PM.    Final     Micro Results   Recent Results (from the past 240 hour(s))  Resp Panel by RT-PCR (Flu A&B, Covid) Nasopharyngeal Swab     Status: Abnormal   Collection Time: 03/04/20  7:33 PM   Specimen: Nasopharyngeal Swab; Nasopharyngeal(NP) swabs in vial transport medium  Result Value Ref Range Status   SARS Coronavirus 2 by RT PCR POSITIVE (A) NEGATIVE Final    Comment: RESULT CALLED TO, READ BACK BY AND VERIFIED WITH: Andree Elk RN 03/04/20 AT 2153 SK (NOTE) SARS-CoV-2 target nucleic acids are DETECTED.  The SARS-CoV-2 RNA is generally detectable in upper respiratory specimens during the acute phase of infection. Positive results are indicative of the presence of the identified virus, but do not rule out bacterial infection or co-infection with other pathogens not detected by the test. Clinical correlation with patient history and other diagnostic information is necessary to determine patient infection status. The expected result is Negative.  Fact Sheet for Patients: BloggerCourse.com  Fact Sheet for Healthcare Providers: SeriousBroker.it  This test is not yet approved or cleared by the [place] FDA and  has been authorized for detection and/or diagnosis of SARS-CoV-2 by FDA under  an Emergency Use Authorization (EUA).  This EUA will remain in effect (meaning this te st can be used) for the duration of  the COVID-19 declaration under Section 564(b)(1) of the Act, 21 U.S.C. section 360bbb-3(b)(1), unless the authorization is terminated or revoked sooner.     Influenza A by PCR NEGATIVE NEGATIVE Final   Influenza B by PCR NEGATIVE NEGATIVE Final    Comment: (NOTE) The Xpert Xpress SARS-CoV-2/FLU/RSV plus assay is intended as an aid in the diagnosis of influenza from Nasopharyngeal swab specimens and should not be used as a sole basis for treatment. Nasal washings and aspirates are unacceptable for Xpert Xpress SARS-CoV-2/FLU/RSV testing.  Fact Sheet for Patients: BloggerCourse.com  Fact Sheet for Healthcare Providers: SeriousBroker.it  This test is not yet approved or cleared by the [place] FDA and has been authorized for detection and/or diagnosis of SARS-CoV-2 by FDA under an Emergency Use Authorization (EUA). This EUA will remain in effect (meaning this test can be used) for the duration of the COVID-19 declaration under Section 564(b)(1) of the Act, 21 U.S.C. section 360bbb-3(b)(1), unless the authorization is terminated or revoked.  Performed at [facility], [address] [place], [place] [address]   Blood Culture (routine x 2)     Status: None   Collection Time: 03/04/20  8:45 PM   Specimen: BLOOD LEFT HAND  Result Value Ref Range Status   Specimen Description BLOOD LEFT HAND  Final   Special Requests   Final    AEROBIC BOTTLE ONLY Blood Culture results may not be optimal due to an inadequate volume of blood received in culture bottles   Culture   Final    NO GROWTH 5 DAYS Performed at [facility], [address] [place], [place] [address]    Report Status 03/09/2020 FINAL  Final  Blood Culture (routine x 2)     Status: None   Collection Time: 03/05/20 12:16 AM   Specimen:  BLOOD LEFT FOREARM  Result Value Ref Range Status   Specimen Description BLOOD LEFT FOREARM  Final   Special Requests   Final  BOTTLES DRAWN AEROBIC AND ANAEROBIC Blood Culture adequate volume   Culture   Final    NO GROWTH 5 DAYS Performed at [facility], [address], [place], [place] [address]    Report Status 03/10/2020 FINAL  Final  MRSA PCR Screening     Status: None   Collection Time: 03/06/20  2:36 PM   Specimen: Nasal Mucosa; Nasopharyngeal  Result Value Ref Range Status   MRSA by PCR NEGATIVE NEGATIVE Final    Comment:        The GeneXpert MRSA Assay (FDA approved for NASAL specimens only), is one component of a comprehensive MRSA colonization surveillance program. It is not intended to diagnose MRSA infection nor to guide or monitor treatment for MRSA infections. Performed at [facility], [address], [place], [place] [address]     Today   Subjective    Makhi Muzquiz today has no headache,no chest abdominal pain,no new weakness tingling or numbness, feels much better wants to go home today.     Objective   Blood pressure (!) 167/76, pulse 70, temperature 97.9 F (36.6 C), temperature source Oral, resp. rate 18, height 5\' 3"  (1.6 m), weight 99.8 kg, SpO2 94 %.   Intake/Output Summary (Last 24 hours) at 03/10/2020 1034 Last data filed at 03/10/2020 0926 Gross per 24 hour  Intake 720 ml  Output --  Net 720 ml    Exam  Awake Alert, No new F.N deficits, Normal affect [place].AT,PERRAL Supple Neck,No JVD, No cervical lymphadenopathy appriciated.  Symmetrical Chest wall movement, Good air movement bilaterally, CTAB RRR,No Gallops,Rubs or new Murmurs, No Parasternal Heave +ve B.Sounds, Abd Soft, Non tender, No organomegaly appriciated, No rebound -guarding or rigidity. No Cyanosis, Clubbing or edema, No new Rash or bruise   Data Review   CBC w Diff:  Lab Results  Component Value Date   WBC 15.6 (H) 03/10/2020   HGB 15.0 03/10/2020    HCT 40.4 03/10/2020   PLT 347 03/10/2020   LYMPHOPCT 7 03/10/2020   MONOPCT 4 03/10/2020   EOSPCT 0 03/10/2020   BASOPCT 0 03/10/2020    CMP:  Lab Results  Component Value Date   NA 133 (L) 03/10/2020   K 3.8 03/10/2020   CL 97 (L) 03/10/2020   CO2 26 03/10/2020   BUN 17 03/10/2020   CREATININE 1.01 03/10/2020   PROT 5.6 (L) 03/10/2020   ALBUMIN 2.6 (L) 03/10/2020   BILITOT 1.2 03/10/2020   ALKPHOS 83 03/10/2020   AST 101 (H) 03/10/2020   ALT 111 (H) 03/10/2020  .   Total Time in preparing paper work, data evaluation and todays exam - 35 minutes  Susa Raring M.D on 03/10/2020 at 10:34 AM  [facility]   Office  [phone]

## 2020-03-10 NOTE — Discharge Instructions (Signed)
Follow with Primary MD Dennard Schaumann, MD in 7 days   Get CBC, CMP, 2 view Chest X ray -  checked next visit within 1 week by Primary MD    Activity: As tolerated with Full fall precautions use walker/cane & assistance as needed  Disposition Home   Diet: Heart Healthy   Special Instructions: If you have smoked or chewed Tobacco  in the last 2 yrs please stop smoking, stop any regular Alcohol  and or any Recreational drug use.  On your next visit with your primary care physician please Get Medicines reviewed and adjusted.  Please request your Prim.MD to go over all Hospital Tests and Procedure/Radiological results at the follow up, please get all Hospital records sent to your Prim MD by signing hospital release before you go home.  If you experience worsening of your admission symptoms, develop shortness of breath, life threatening emergency, suicidal or homicidal thoughts you must seek medical attention immediately by calling 911 or calling your MD immediately  if symptoms less severe.  You Must read complete instructions/literature along with all the possible adverse reactions/side effects for all the Medicines you take and that have been prescribed to you. Take any new Medicines after you have completely understood and accpet all the possible adverse reactions/side effects.       Person Under Monitoring Name: Adam Edwards  Location: [address] [place] [address]   Infection Prevention Recommendations for Individuals Confirmed to have, or Being Evaluated for, 2019 Novel Coronavirus (COVID-19) Infection Who Receive Care at Home  Individuals who are confirmed to have, or are being evaluated for, COVID-19 should follow the prevention steps below until a healthcare provider or local or state health department says they can return to normal activities.  Stay home except to get medical care You should restrict activities outside your home, except for getting medical  care. Do not go to work, school, or public areas, and do not use public transportation or taxis.  Call ahead before visiting your doctor Before your medical appointment, call the healthcare provider and tell them that you have, or are being evaluated for, COVID-19 infection. This will help the healthcare provider's office take steps to keep other people from getting infected. Ask your healthcare provider to call the local or state health department.  Monitor your symptoms Seek prompt medical attention if your illness is worsening (e.g., difficulty breathing). Before going to your medical appointment, call the healthcare provider and tell them that you have, or are being evaluated for, COVID-19 infection. Ask your healthcare provider to call the local or state health department.  Wear a facemask You should wear a facemask that covers your nose and mouth when you are in the same room with other people and when you visit a healthcare provider. People who live with or visit you should also wear a facemask while they are in the same room with you.  Separate yourself from other people in your home As much as possible, you should stay in a different room from other people in your home. Also, you should use a separate bathroom, if available.  Avoid sharing household items You should not share dishes, drinking glasses, cups, eating utensils, towels, bedding, or other items with other people in your home. After using these items, you should wash them thoroughly with soap and water.  Cover your coughs and sneezes Cover your mouth and nose with a tissue when you cough or sneeze, or you can cough or sneeze into your  sleeve. Throw used tissues in a lined trash can, and immediately wash your hands with soap and water for at least 20 seconds or use an alcohol-based hand rub.  Wash your [facility] your hands often and thoroughly with soap and water for at least 20 seconds. You can use an alcohol-based  hand sanitizer if soap and water are not available and if your hands are not visibly dirty. Avoid touching your eyes, nose, and mouth with unwashed hands.   Prevention Steps for Caregivers and Household Members of Individuals Confirmed to have, or Being Evaluated for, COVID-19 Infection Being Cared for in the Home  If you live with, or provide care at home for, a person confirmed to have, or being evaluated for, COVID-19 infection please follow these guidelines to prevent infection:  Follow healthcare provider's instructions Make sure that you understand and can help the patient follow any healthcare provider instructions for all care.  Provide for the patient's basic needs You should help the patient with basic needs in the home and provide support for getting groceries, prescriptions, and other personal needs.  Monitor the patient's symptoms If they are getting sicker, call his or her medical provider and tell them that the patient has, or is being evaluated for, COVID-19 infection. This will help the healthcare provider's office take steps to keep other people from getting infected. Ask the healthcare provider to call the local or state health department.  Limit the number of people who have contact with the patient  If possible, have only one caregiver for the patient.  Other household members should stay in another home or place of residence. If this is not possible, they should stay  in another room, or be separated from the patient as much as possible. Use a separate bathroom, if available.  Restrict visitors who do not have an essential need to be in the home.  Keep older adults, very young children, and other sick people away from the patient Keep older adults, very young children, and those who have compromised immune systems or chronic health conditions away from the patient. This includes people with chronic heart, lung, or kidney conditions, diabetes, and  cancer.  Ensure good ventilation Make sure that shared spaces in the home have good air flow, such as from an air conditioner or an opened window, weather permitting.  Wash your hands often  Wash your hands often and thoroughly with soap and water for at least 20 seconds. You can use an alcohol based hand sanitizer if soap and water are not available and if your hands are not visibly dirty.  Avoid touching your eyes, nose, and mouth with unwashed hands.  Use disposable paper towels to dry your hands. If not available, use dedicated cloth towels and replace them when they become wet.  Wear a facemask and gloves  Wear a disposable facemask at all times in the room and gloves when you touch or have contact with the patient's blood, body fluids, and/or secretions or excretions, such as sweat, saliva, sputum, nasal mucus, vomit, urine, or feces.  Ensure the mask fits over your nose and mouth tightly, and do not touch it during use.  Throw out disposable facemasks and gloves after using them. Do not reuse.  Wash your hands immediately after removing your facemask and gloves.  If your personal clothing becomes contaminated, carefully remove clothing and launder. Wash your hands after handling contaminated clothing.  Place all used disposable facemasks, gloves, and other waste in a lined  container before disposing them with other household waste.  Remove gloves and wash your hands immediately after handling these items.  Do not share dishes, glasses, or other household items with the patient  Avoid sharing household items. You should not share dishes, drinking glasses, cups, eating utensils, towels, bedding, or other items with a patient who is confirmed to have, or being evaluated for, COVID-19 infection.  After the person uses these items, you should wash them thoroughly with soap and water.  Wash laundry thoroughly  Immediately remove and wash clothes or bedding that have blood, body  fluids, and/or secretions or excretions, such as sweat, saliva, sputum, nasal mucus, vomit, urine, or feces, on them.  Wear gloves when handling laundry from the patient.  Read and follow directions on labels of laundry or clothing items and detergent. In general, wash and dry with the warmest temperatures recommended on the label.  Clean all areas the individual has used often  Clean all touchable surfaces, such as counters, tabletops, doorknobs, bathroom fixtures, toilets, phones, keyboards, tablets, and bedside tables, every day. Also, clean any surfaces that may have blood, body fluids, and/or secretions or excretions on them.  Wear gloves when cleaning surfaces the patient has come in contact with.  Use a diluted bleach solution (e.g., dilute bleach with 1 part bleach and 10 parts water) or a household disinfectant with a label that says EPA-registered for coronaviruses. To make a bleach solution at home, add 1 tablespoon of bleach to 1 quart (4 cups) of water. For a larger supply, add  cup of bleach to 1 gallon (16 cups) of water.  Read labels of cleaning products and follow recommendations provided on product labels. Labels contain instructions for safe and effective use of the cleaning product including precautions you should take when applying the product, such as wearing gloves or eye protection and making sure you have good ventilation during use of the product.  Remove gloves and wash hands immediately after cleaning.  Monitor yourself for signs and symptoms of illness Caregivers and household members are considered close contacts, should monitor their health, and will be asked to limit movement outside of the home to the extent possible. Follow the monitoring steps for close contacts listed on the symptom monitoring form.   ? If you have additional questions, contact your local health department or call the epidemiologist on call at [phone] (available 24/7). ? This  guidance is subject to change. For the most up-to-date guidance from [facility], please refer to their website: [email or web address]

## 2020-03-10 NOTE — Plan of Care (Signed)
Patient is currently resting in bed. Prone. VSS. Currently on 2L [place], continue to wean oxygen down. OOB to [facility]. Call bell within reach. No complaints overnight.   Problem: Education: Goal: Knowledge of risk factors and measures for prevention of condition will improve Outcome: Progressing   Problem: Coping: Goal: Psychosocial and spiritual needs will be supported Outcome: Progressing   Problem: Respiratory: Goal: Will maintain a patent airway Outcome: Progressing Goal: Complications related to the disease process, condition or treatment will be avoided or minimized Outcome: Progressing

## 2020-03-10 NOTE — TOC Transition Note (Signed)
Transition of Care (TOC) - CM/SW Discharge Note Donn Pierini RN, BSN Transitions of Care Unit 4E- RN Case Manager See Treatment Team for direct phone # Cross coverage for 5W   Patient Details  Name: Adam Edwards MRN: [id number] Date of Birth: [birth year]  Transition of Care [facility]) CM/SW Contact:  Darrold Span, RN Phone Number: 03/10/2020, 2:57 PM   Clinical Narrative:    Pt stable for transition home, order for home 02 in place- received a return call from the [place] this am- will need to work with the [place] home 02 division - call made to [place] [facility] [place] [facility] [phone] ext. [phone]- discussed pt ready for d/c and home 02 needs- needed paperwork faxed to [place] at [phone]. Once processed and approved by the [place] - they will arrange through [facility] for home delivery.  Pt is new to Eliquis- printed 30 day free card for family to use on d/c at [facility]- unit to place with d/c paperwork to give to pt on discharge. Spoke with daughter to explain about coupon and use- and to be sure [facility] prior to next fill.   1440- received call from the [place] -[place] has approved home 02 - is in process of getting in touch with family for home delivery- daughter's contact provided to the [place]- once they have contacted family for delivery- [facility] has a 4 hr delivery window - they will deliver everything to the home and family will need to bring portable tank to transport patient home with. Updated unit regarding home 02 delivery. Family will contact unit once home 02 has been delivered.    Final next level of care: Home/Self Care Barriers to Discharge: Barriers Resolved   Patient Goals and CMS Choice Patient states their goals for this hospitalization and ongoing recovery are:: return home CMS Medicare.gov Compare Post Acute Care list provided to:: Patient Represenative (must comment) (daughterUrsula Alert) Choice offered to / list presented to : Adult Children  Discharge  Placement                 Home with [facility]      Discharge Plan and Services   Discharge Planning Services: CM Consult Post Acute Care Choice: Durable Medical Equipment          DME Arranged: Oxygen DME Agency: [facility] [place] Date DME Agency Contacted: 03/10/20 Time DME Agency Contacted: 1000 Representative spoke with at DME Agency: Vita Barley HH Arranged: NA HH Agency: NA        Social Determinants of Health (SDOH) Interventions     Readmission Risk Interventions Readmission Risk Prevention Plan 03/10/2020  Post Dischage Appt Complete  Medication Screening Complete  Transportation Screening Complete

## 2020-03-10 NOTE — Progress Notes (Addendum)
SATURATION QUALIFICATIONS: (This note is used to comply with regulatory documentation for home oxygen)  Patient Saturations on Room Air at Rest = 86% Patient Saturations on 3L at rest = 97%  Patient Saturations on 3 Liters of oxygen while Ambulating = 92%  Please briefly explain why patient needs home oxygen: Patient oxygen decreases to 84% while ambulating without O2

## 2021-10-15 IMAGING — CT CT ANGIO CHEST
2 of 4 series · 18 of 46 positions shown · IV contrast (omnipaque)
Comparison: None.

CLINICAL DATA: Pulmonary embolism, dyspnea, COVID pneumonia

EXAM:
CT ANGIOGRAPHY CHEST WITH CONTRAST
TECHNIQUE: Multidetector CT imaging of the chest was performed using the
standard protocol during bolus administration of intravenous
contrast. Multiplanar CT image reconstructions and MIPs were
obtained to evaluate the vascular anatomy.
CONTRAST:  60mL OMNIPAQUE IOHEXOL 350 MG/ML SOLN

[Series 6: thins · axial · 0.61mm/px · z∈[-299,-43]mm · 15 of 282 slices shown]
[im 13/282  lung]
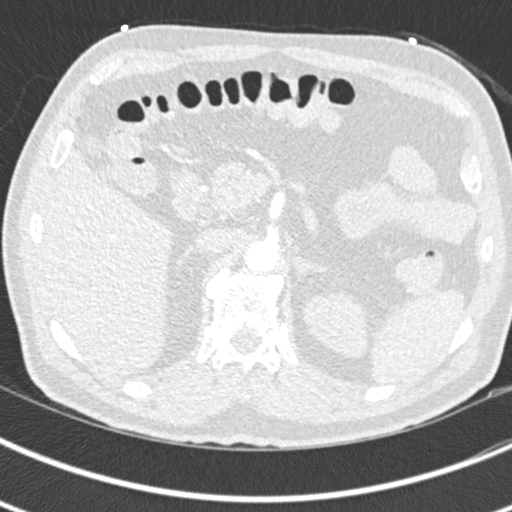
[im 37/282  soft-tissue]
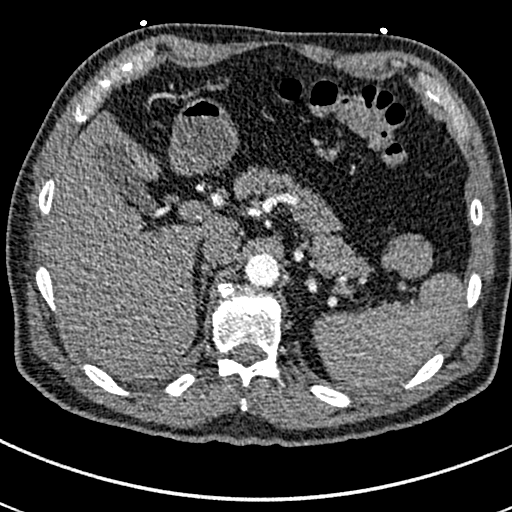
[im 49/282  lung]
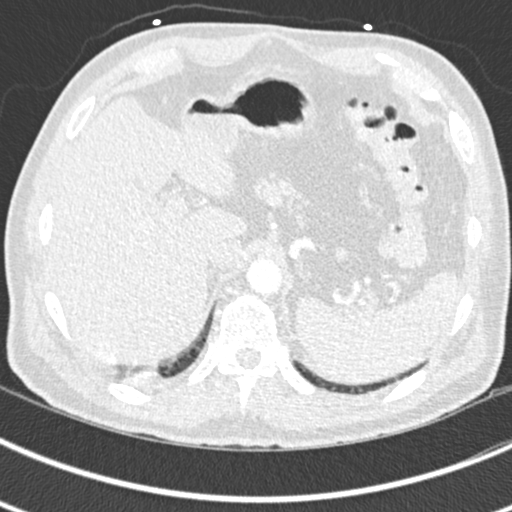
[im 74/282  soft-tissue]
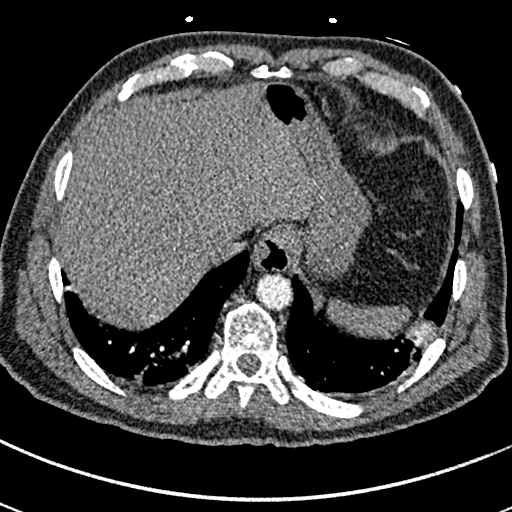
[im 86/282  lung]
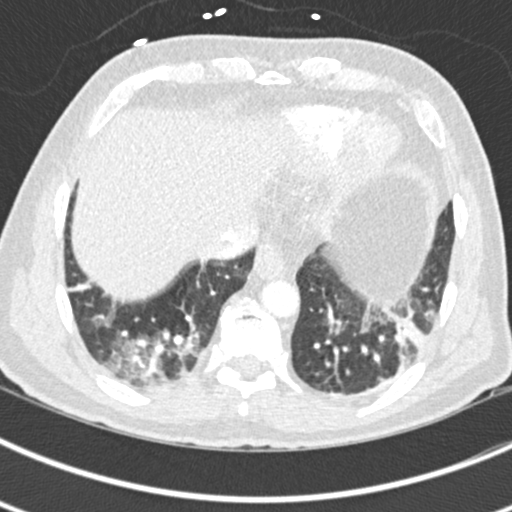
[im 110/282  soft-tissue]
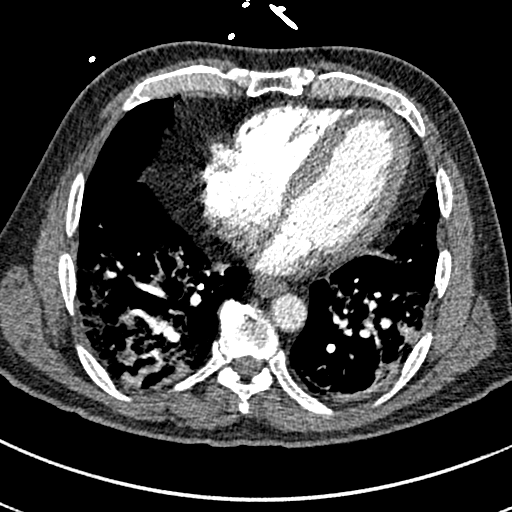
[im 123/282  lung]
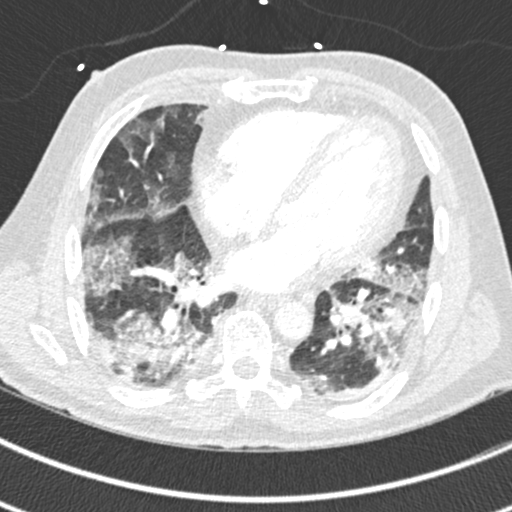
[im 147/282  soft-tissue]
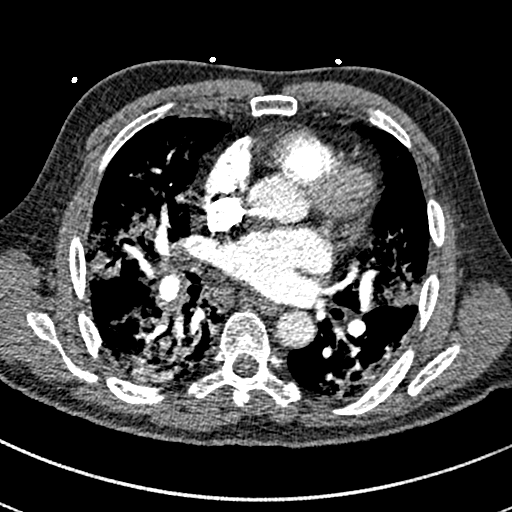
[im 159/282  lung]
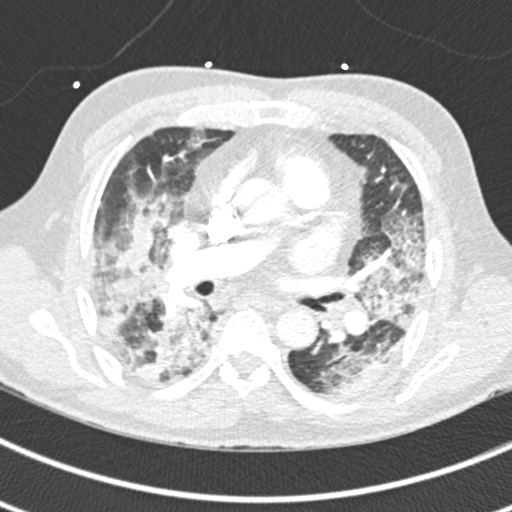
[im 172/282  soft-tissue]
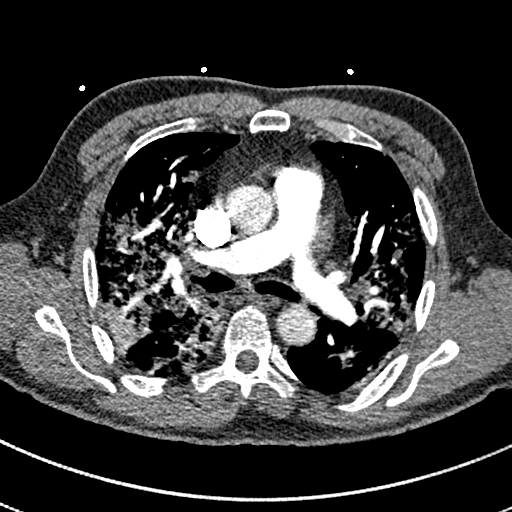
[im 196/282  lung]
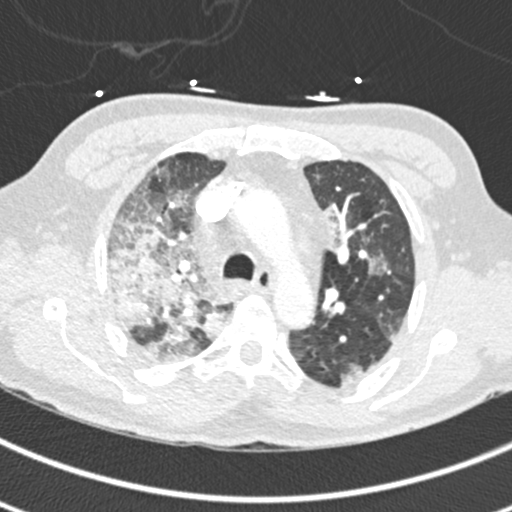
[im 208/282  soft-tissue]
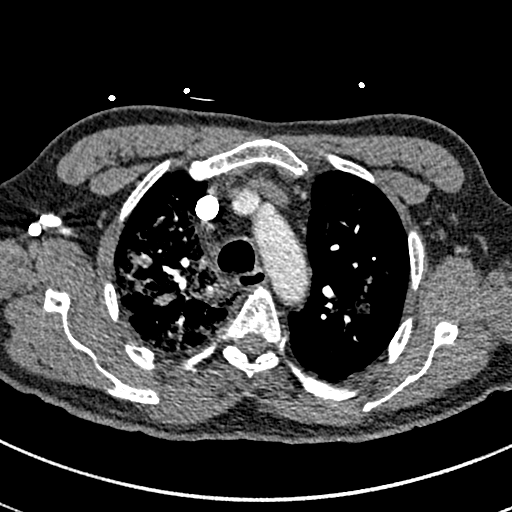
[im 233/282  lung]
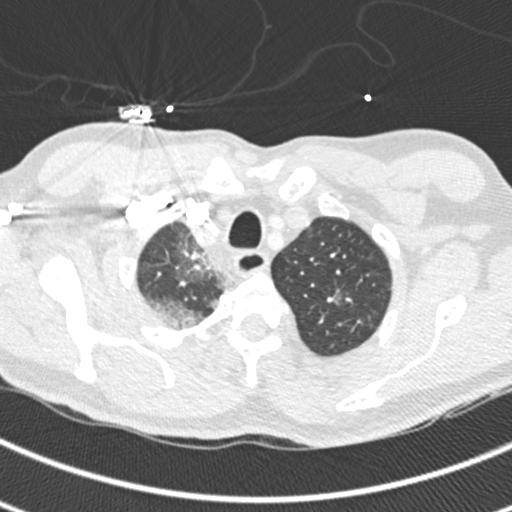
[im 245/282  soft-tissue]
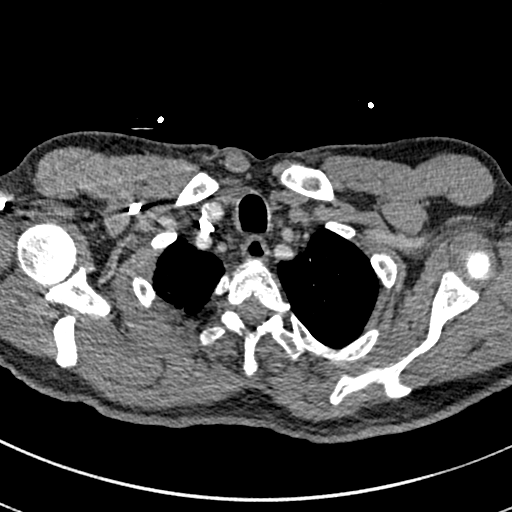
[im 269/282  lung]
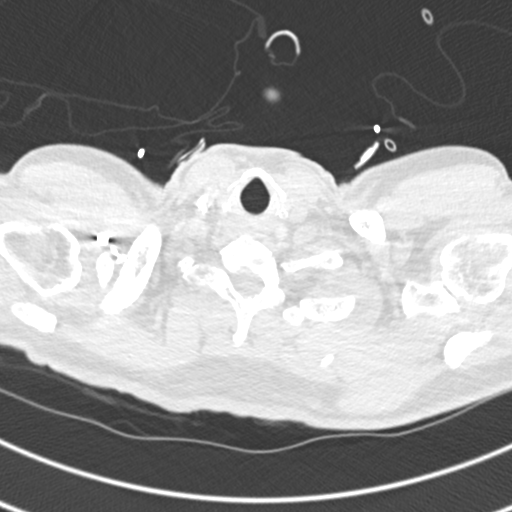

[Series 8: coronal mpr · coronal · 0.53mm/px · 3 of 123 slices shown]
[im 41/123  soft-tissue]
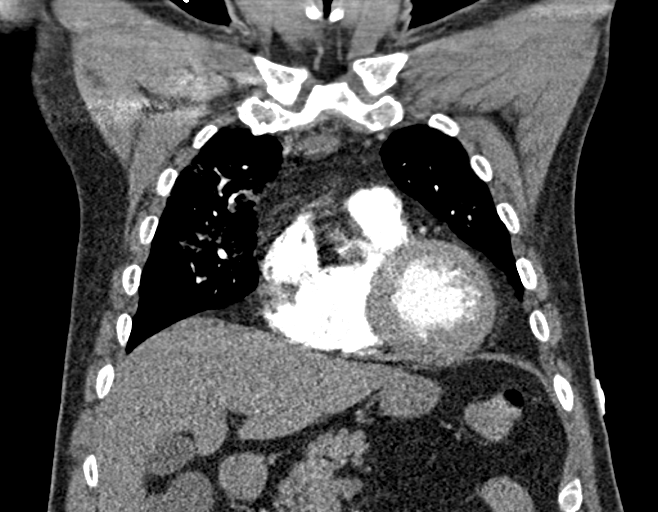
[im 55/123  soft-tissue]
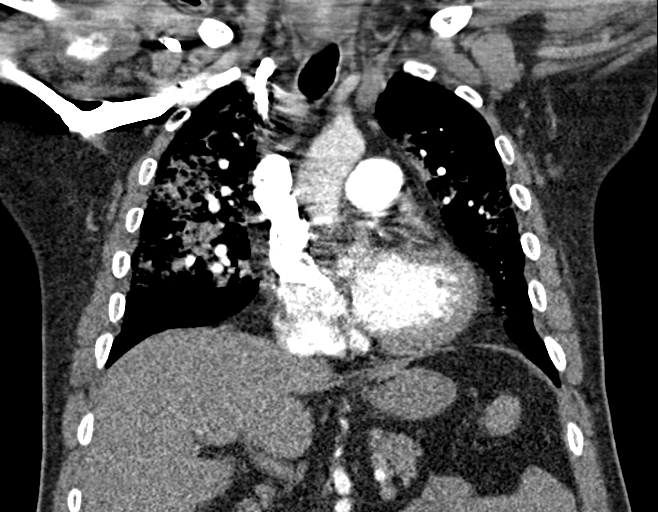
[im 68/123  soft-tissue]
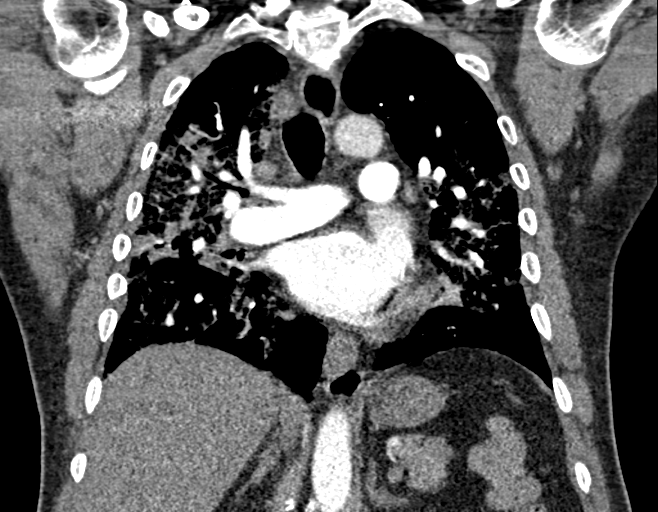

[18 of 46 positions shown; findings below may reference images not displayed]

FINDINGS: Cardiovascular: There is adequate opacification of the pulmonary
arterial tree. No intraluminal filling defect identified to suggest
acute pulmonary embolism. The pulmonary arteries are of normal
caliber. Mild coronary artery calcification. Global cardiac size
within normal limits. No pericardial effusion. Thoracic aorta
demonstrates mild atherosclerotic calcification within the arch. No
aortic aneurysm.

Mediastinum/Nodes: No enlarged mediastinal, hilar, or axillary lymph
nodes. Thyroid gland, trachea, and esophagus demonstrate no
significant findings. Small hiatal hernia noted.

Lungs/Pleura: There is extensive bilateral asymmetric diffuse
ground-glass pulmonary infiltrate and peripheral areas of
consolidation in keeping with changes of atypical infection in the
acute setting and typical of that seen with RUNKR-99 pneumonia.
There is moderate to severe parenchymal involvement. No pneumothorax
or pleural effusion. No central obstructing lesion.

Upper Abdomen: No acute abnormality.

Musculoskeletal: No chest wall abnormality. No acute or significant
osseous findings.

Review of the MIP images confirms the above findings.
IMPRESSION: No pulmonary embolism.

Asymmetric bilateral pulmonary infiltrate infiltrates in keeping
with atypical infection and typical of that seen with acute RUNKR-99
pneumonia. Moderate to severe parenchymal involvement.

Mild coronary artery calcification.

Aortic Atherosclerosis ([id number]).

## 2021-10-19 IMAGING — DX DG CHEST 1V PORT
1 series · 1 of 1 positions shown · non-contrast
Comparison: 03/06/2020

CLINICAL DATA: Shortness of breath, COVID

EXAM:
PORTABLE CHEST 1 VIEW

[chest ap]
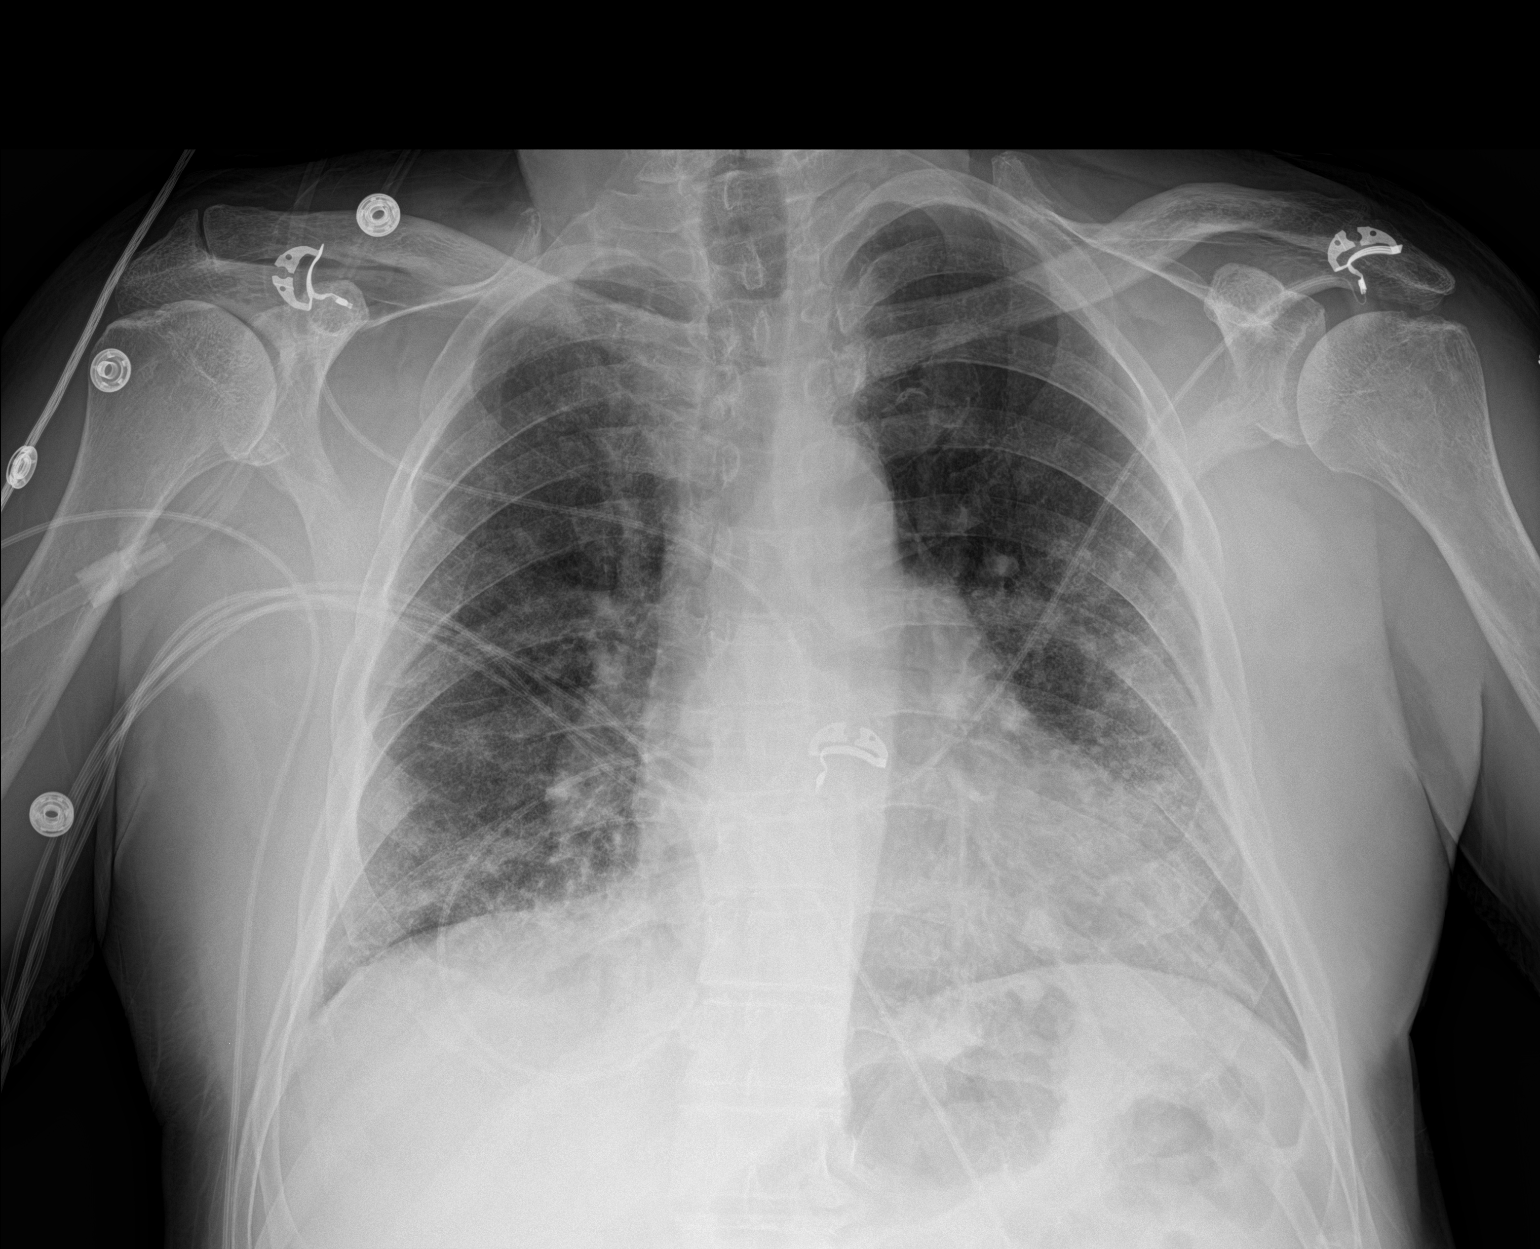

[1 of 1 positions shown; findings below may reference images not displayed]

FINDINGS: Patchy bilateral airspace opacities, improved on the right since
prior study, stable on the left. Heart is normal size. No effusions
or pneumothorax. No acute bony abnormality.
IMPRESSION: Patchy bilateral airspace disease with some improvement on the right
since prior study.

## 2023-10-13 ENCOUNTER — Emergency Department (HOSPITAL_COMMUNITY)

## 2023-10-13 ENCOUNTER — Encounter (HOSPITAL_COMMUNITY): Payer: Self-pay

## 2023-10-13 ENCOUNTER — Inpatient Hospital Stay (HOSPITAL_COMMUNITY)
Admission: EM | Admit: 2023-10-13 | Discharge: 2023-10-14 | DRG: 177 | Disposition: A | Discharge status: Home / Self Care | Attending: Student | Admitting: Student

## 2023-10-13 DIAGNOSIS — F4310 Post-traumatic stress disorder, unspecified: Secondary | ICD-10-CM | POA: Diagnosis present

## 2023-10-13 DIAGNOSIS — J9601 Acute respiratory failure with hypoxia: Secondary | ICD-10-CM | POA: Diagnosis present

## 2023-10-13 DIAGNOSIS — E785 Hyperlipidemia, unspecified: Secondary | ICD-10-CM | POA: Diagnosis present

## 2023-10-13 DIAGNOSIS — I10 Essential (primary) hypertension: Secondary | ICD-10-CM | POA: Diagnosis present

## 2023-10-13 DIAGNOSIS — E039 Hypothyroidism, unspecified: Secondary | ICD-10-CM | POA: Diagnosis present

## 2023-10-13 DIAGNOSIS — K219 Gastro-esophageal reflux disease without esophagitis: Secondary | ICD-10-CM | POA: Diagnosis present

## 2023-10-13 DIAGNOSIS — Z79899 Other long term (current) drug therapy: Secondary | ICD-10-CM

## 2023-10-13 DIAGNOSIS — R531 Weakness: Secondary | ICD-10-CM | POA: Diagnosis present

## 2023-10-13 DIAGNOSIS — Z79890 Hormone replacement therapy (postmenopausal): Secondary | ICD-10-CM

## 2023-10-13 DIAGNOSIS — E66812 Obesity, class 2: Secondary | ICD-10-CM | POA: Diagnosis present

## 2023-10-13 DIAGNOSIS — U071 COVID-19: Principal | ICD-10-CM | POA: Diagnosis present

## 2023-10-13 DIAGNOSIS — E876 Hypokalemia: Secondary | ICD-10-CM | POA: Diagnosis present

## 2023-10-13 DIAGNOSIS — Z6838 Body mass index (BMI) 38.0-38.9, adult: Secondary | ICD-10-CM | POA: Diagnosis not present

## 2023-10-13 DIAGNOSIS — Z7901 Long term (current) use of anticoagulants: Secondary | ICD-10-CM

## 2023-10-13 LAB — CBC
HCT: 45.4 % (ref 39.0–52.0)
Hemoglobin: 15.9 g/dL (ref 13.0–17.0)
MCH: 32.6 pg (ref 26.0–34.0)
MCHC: 35 g/dL (ref 30.0–36.0)
MCV: 93.2 fL (ref 80.0–100.0)
Platelets: 168 K/uL (ref 150–400)
RBC: 4.87 MIL/uL (ref 4.22–5.81)
RDW: 12.4 % (ref 11.5–15.5)
WBC: 11.5 K/uL — ABNORMAL HIGH (ref 4.0–10.5)
nRBC: 0 % (ref 0.0–0.2)

## 2023-10-13 LAB — BASIC METABOLIC PANEL WITH GFR
Anion gap: 12 (ref 5–15)
BUN: 13 mg/dL (ref 8–23)
CO2: 26 mmol/L (ref 22–32)
Calcium: 9.2 mg/dL (ref 8.9–10.3)
Chloride: 95 mmol/L — ABNORMAL LOW (ref 98–111)
Creatinine, Ser: 1.02 mg/dL (ref 0.61–1.24)
GFR, Estimated: >60 mL/min (ref >60)
Glucose, Bld: 111 mg/dL — ABNORMAL HIGH (ref 70–99)
Potassium: 3.6 mmol/L (ref 3.5–5.1)
Sodium: 133 mmol/L — ABNORMAL LOW (ref 135–145)

## 2023-10-13 LAB — RESP PANEL BY RT-PCR (RSV, FLU A&B, COVID)  RVPGX2
Influenza A by PCR: NEGATIVE
Influenza B by PCR: NEGATIVE
Resp Syncytial Virus by PCR: NEGATIVE
SARS Coronavirus 2 by RT PCR: POSITIVE — AB

## 2023-10-13 MED ORDER — IPRATROPIUM-ALBUTEROL 0.5-2.5 (3) MG/3ML IN SOLN
3.0000 mL | Freq: Once | RESPIRATORY_TRACT | Status: AC
  Administered 2023-10-13: 3 mL via RESPIRATORY_TRACT
  Filled 2023-10-13: qty 3

## 2023-10-13 MED ORDER — ALBUTEROL SULFATE HFA 108 (90 BASE) MCG/ACT IN AERS: 2.0000 | INHALATION_SPRAY | RESPIRATORY_TRACT | 0 refills | Status: AC | PRN

## 2023-10-13 MED ORDER — DEXAMETHASONE SODIUM PHOSPHATE 10 MG/ML IJ SOLN
6.0000 mg | Freq: Once | INTRAMUSCULAR | Status: AC
  Administered 2023-10-13: 6 mg via INTRAVENOUS
  Filled 2023-10-13: qty 1

## 2023-10-13 NOTE — H&P (Signed)
 History and Physical  Adam Edwards FMW:[id number] DOB: [birth year] DOA: 10/13/2023  PCP: [facility]   Chief Complaint: Generalized weakness  HPI: Adam Edwards is a 78 y.o. male with medical history significant for HTN, HLD, hypothyroidism, hypokalemia, obesity, osteoarthritis, PTSD, GERD and recently diagnosed COVID infection who presented to the ED for evaluation of generalized weakness.  ED Course: Initial vitals show temp 98.0, RR 18, HR 94, BP 158/72, SpO2 89% on room air, placed on 3 L [place] with SpO2 of 94%. Initial labs significant for WBC 11.5, sodium 133, K+ 3.6, glucose 111, creatinine 1.01, and positive COVID test.  CXR shows questionable airspace opacity versus confluence of shadows in the left midlung. Pt received DuoNeb and IV Decadron  6 mg x 1. TRH was consulted for admission.   Review of Systems: Please see HPI for pertinent positives and negatives. A complete 10 system review of systems are otherwise negative.  Past Medical History:  Diagnosis Date   Hypertension    History reviewed. No pertinent surgical history. Social History:  reports that he has never smoked. He has never used smokeless tobacco. He reports that he does not drink alcohol and does not use drugs.  No Known Allergies  Family History  Problem Relation Age of Onset   Diabetes Mellitus II Neg Hx      Prior to Admission medications   Medication Sig Start Date End Date Taking? Authorizing Provider  albuterol  (VENTOLIN  HFA) 108 (90 Base) MCG/ACT inhaler Inhale 2 puffs into the lungs every 4 (four) hours as needed for wheezing or shortness of breath. 10/13/23  Yes Ula Prentice SAUNDERS, MD  albuterol  (VENTOLIN  HFA) 108 (90 Base) MCG/ACT inhaler Inhale 2 puffs into the lungs every 6 (six) hours as needed for wheezing or shortness of breath. 03/10/20   Singh, Prashant K, MD  amLODipine  (NORVASC ) 10 MG tablet Take 10 mg by mouth daily.    [provider]  apixaban  (ELIQUIS ) 2.5 MG TABS tablet Take  1 tablet (2.5 mg total) by mouth 2 (two) times daily. 03/10/20   Singh, Prashant K, MD  atorvastatin  (LIPITOR) 40 MG tablet Take 40 mg by mouth at bedtime.    [provider]  chlorthalidone  (HYGROTON ) 25 MG tablet Take 12.5 mg by mouth daily.    [provider]  doxycycline  (VIBRA -TABS) 100 MG tablet Take 1 tablet (100 mg total) by mouth every 12 (twelve) hours. 03/10/20   Singh, Prashant K, MD  FLUoxetine  (PROZAC ) 20 MG capsule Take 20 mg by mouth daily.    [provider]  hydrOXYzine (VISTARIL) 25 MG capsule Take 25 mg by mouth daily as needed for itching.    [provider]  levothyroxine  (SYNTHROID ) 100 MCG tablet Take 100 mcg by mouth daily before breakfast.    [provider]  methylPREDNISolone  (MEDROL  DOSEPAK) 4 MG TBPK tablet follow package directions 03/10/20   Singh, Prashant K, MD  Omega-3 Fatty Acids (FISH OIL) 1000 MG CAPS Take 2,000 mg by mouth in the morning and at bedtime.    [provider]  omeprazole (PRILOSEC) 20 MG capsule Take 20 mg by mouth daily as needed (acid reflux). Take on empty stomach 30 minutes prior to a meal    [provider]  pantoprazole  (PROTONIX ) 40 MG tablet Take 1 tablet (40 mg total) by mouth daily at 12 noon. 03/10/20   Singh, Prashant K, MD  potassium chloride  SA (KLOR-CON ) 20 MEQ tablet Take 20 mEq by mouth 3 (three) times daily with  meals.    [provider]    Physical Exam: BP (!) 143/70   Pulse 68   Temp 98.6 F (37 C) (Oral)   Resp 17   Ht 5' 3 (1.6 m)   Wt 99 kg   SpO2 95%   BMI 38.66 kg/m  General: Pleasant, well-appearing *** laying in bed. No acute distress. HEENT: [place]/AT. Anicteric sclera CV: RRR. No murmurs, rubs, or gallops. No LE edema Pulmonary: Lungs CTAB. Normal effort. No wheezing or rales. Abdominal: Soft, nontender, nondistended. Normal bowel sounds. Extremities: Palpable radial and DP pulses. Normal ROM. Skin: Warm and dry. No obvious rash or  lesions. Neuro: A&Ox3. Moves all extremities. Normal sensation to light touch. No focal deficit. Psych: Normal mood and affect          Labs on Admission:  Basic Metabolic Panel: Recent Labs  Lab 10/13/23 1822  NA 133*  K 3.6  CL 95*  CO2 26  GLUCOSE 111*  BUN 13  CREATININE 1.02  CALCIUM  9.2   Liver Function Tests: No results for input(s): AST, ALT, ALKPHOS, BILITOT, PROT, ALBUMIN in the last 168 hours. No results for input(s): LIPASE, AMYLASE in the last 168 hours. No results for input(s): AMMONIA in the last 168 hours. CBC: Recent Labs  Lab 10/13/23 1822  WBC 11.5*  HGB 15.9  HCT 45.4  MCV 93.2  PLT 168   Cardiac Enzymes: No results for input(s): CKTOTAL, CKMB, CKMBINDEX, TROPONINI in the last 168 hours. BNP (last 3 results) No results for input(s): BNP in the last 8760 hours.  ProBNP (last 3 results) No results for input(s): PROBNP in the last 8760 hours.  CBG: No results for input(s): GLUCAP in the last 168 hours.  Radiological Exams on Admission: DG Chest Portable 1 View Result Date: 10/13/2023 CLINICAL DATA:  Weakness, shortness of breath, altered mental status EXAM: PORTABLE CHEST 1 VIEW COMPARISON:  03/09/2020 FINDINGS: Stable cardiomediastinal silhouette. Pulmonary vascular congestion. Low lung volumes. Question airspace opacity versus confluence of shadows in the left mid lung laterally. Bibasilar atelectasis. No pleural effusion or pneumothorax. No displaced rib fractures. IMPRESSION: 1. Question airspace opacity versus confluence of shadows in the left mid lung laterally. Consider further evaluation with CT. Electronically Signed   By: Norman Gatlin M.D.   On: 10/13/2023 18:36   Assessment/Plan Adam Edwards is a 78 y.o. male with medical history significant for HTN, HLD, hypothyroidism, hypokalemia, obesity, osteoarthritis, PTSD, GERD and recently diagnosed COVID infection who presented to the ED for evaluation of  generalized weakness and admitted for acute respiratory failure secondary to COVID-19  # Acute hypoxic respiratory failure  # COVID-19 infection  #***  #***  #***  #***  #***  DVT prophylaxis: Lovenox      Code Status: Prior  Consults called: None  Family Communication: ***  Severity of Illness: The appropriate patient status for this patient is INPATIENT. Inpatient status is judged to be reasonable and necessary in order to provide the required intensity of service to ensure the patient's safety. The patient's presenting symptoms, physical exam findings, and initial radiographic and laboratory data in the context of their chronic comorbidities is felt to place them at high risk for further clinical deterioration. Furthermore, it is not anticipated that the patient will be medically stable for discharge from the hospital within 2 midnights of admission.   * I certify that at the point of admission it is my clinical judgment that the patient will require inpatient hospital care spanning beyond 2  midnights from the point of admission due to high intensity of service, high risk for further deterioration and high frequency of surveillance required.*  Level of care: Telemetry   This record has been created using Conservation officer, historic buildings. Errors have been sought and corrected, but may not always be located. Such creation errors do not reflect on the standard of care.   Lou Claretta HERO, MD 10/13/2023, 11:55 PM [facility] Pager: [phone] Isaiah 41:10   If 7PM-7AM, please contact night-coverage [email or web address] Password [email or web address]

## 2023-10-13 NOTE — ED Provider Notes (Addendum)
 [place] EMERGENCY DEPARTMENT AT [facility] Provider Note   CSN: [id number] Arrival date & time: 10/13/23  1717     Patient presents with: Weakness   Adam Edwards is a 78 y.o. male.   78 year old male with past medical history of hypertension and hypothyroidism presents emergency department today with generalized weakness.  The patient was apparently diagnosed with COVID earlier.  Was having some generalized weakness and has had issues with hypokalemia in the past.  He was brought in today for further evaluation regarding this.  He denies any chest pain or shortness of breath.  He is somewhat of a poor historian and the majority of history is obtained looking at his triage note.   Weakness      Prior to Admission medications   Medication Sig Start Date End Date Taking? Authorizing Provider  albuterol  (VENTOLIN  HFA) 108 (90 Base) MCG/ACT inhaler Inhale 2 puffs into the lungs every 6 (six) hours as needed for wheezing or shortness of breath. 03/10/20   Singh, Prashant K, MD  amLODipine  (NORVASC ) 10 MG tablet Take 10 mg by mouth daily.    [provider]  apixaban  (ELIQUIS ) 2.5 MG TABS tablet Take 1 tablet (2.5 mg total) by mouth 2 (two) times daily. 03/10/20   Singh, Prashant K, MD  atorvastatin  (LIPITOR) 40 MG tablet Take 40 mg by mouth at bedtime.    [provider]  chlorthalidone  (HYGROTON ) 25 MG tablet Take 12.5 mg by mouth daily.    [provider]  doxycycline  (VIBRA -TABS) 100 MG tablet Take 1 tablet (100 mg total) by mouth every 12 (twelve) hours. 03/10/20   Singh, Prashant K, MD  FLUoxetine  (PROZAC ) 20 MG capsule Take 20 mg by mouth daily.    [provider]  hydrOXYzine (VISTARIL) 25 MG capsule Take 25 mg by mouth daily as needed for itching.    [provider]  levothyroxine  (SYNTHROID ) 100 MCG tablet Take 100 mcg by mouth daily before breakfast.    [provider]  methylPREDNISolone  (MEDROL  DOSEPAK) 4 MG TBPK  tablet follow package directions 03/10/20   Singh, Prashant K, MD  Omega-3 Fatty Acids (FISH OIL) 1000 MG CAPS Take 2,000 mg by mouth in the morning and at bedtime.    [provider]  omeprazole (PRILOSEC) 20 MG capsule Take 20 mg by mouth daily as needed (acid reflux). Take on empty stomach 30 minutes prior to a meal    [provider]  pantoprazole  (PROTONIX ) 40 MG tablet Take 1 tablet (40 mg total) by mouth daily at 12 noon. 03/10/20   Singh, Prashant K, MD  potassium chloride  SA (KLOR-CON ) 20 MEQ tablet Take 20 mEq by mouth 3 (three) times daily with meals.    [provider]    Allergies: Patient has no known allergies.    Review of Systems  Neurological:  Positive for weakness.  All other systems reviewed and are negative.   Updated Vital Signs BP 139/71   Pulse 74   Temp 98.6 F (37 C) (Oral)   Resp 17   Ht 5' 3 (1.6 m)   Wt 99 kg   SpO2 95%   BMI 38.66 kg/m   Physical Exam Vitals and nursing note reviewed.   Gen: NAD Eyes: PERRL, EOMI HEENT: no oropharyngeal swelling Neck: trachea midline Resp: clear to auscultation bilaterally, diminished at bilateral lung bases Card: RRR, no murmurs, rubs, or gallops Abd: nontender, nondistended Extremities: no calf tenderness, no edema Vascular: 2+ radial pulses bilaterally,  2+ DP pulses bilaterally Skin: no rashes Psyc: acting appropriately   (all labs ordered are listed, but only abnormal results are displayed) Labs Reviewed  RESP PANEL BY RT-PCR (RSV, FLU A&B, COVID)  RVPGX2 - Abnormal; Notable for the following components:      Result Value   SARS Coronavirus 2 by RT PCR POSITIVE (*)    All other components within normal limits  CBC - Abnormal; Notable for the following components:   WBC 11.5 (*)    All other components within normal limits  BASIC METABOLIC PANEL WITH GFR - Abnormal; Notable for the following components:   Sodium 133 (*)    Chloride 95 (*)    Glucose, Bld 111 (*)    All  other components within normal limits    EKG: None  Radiology: DG Chest Portable 1 View Result Date: 10/13/2023 CLINICAL DATA:  Weakness, shortness of breath, altered mental status EXAM: PORTABLE CHEST 1 VIEW COMPARISON:  03/09/2020 FINDINGS: Stable cardiomediastinal silhouette. Pulmonary vascular congestion. Low lung volumes. Question airspace opacity versus confluence of shadows in the left mid lung laterally. Bibasilar atelectasis. No pleural effusion or pneumothorax. No displaced rib fractures. IMPRESSION: 1. Question airspace opacity versus confluence of shadows in the left mid lung laterally. Consider further evaluation with CT. Electronically Signed   By: Norman Gatlin M.D.   On: 10/13/2023 18:36     Procedures   Medications Ordered in the ED - No data to display                                  Medical Decision Making 78 year old male with past medical history of hypertension and hypothyroidism presenting to the emergency department today with generalized weakness.  The patient was apparently recently diagnosed with COVID.  Will repeat this here to confirm.  Will also obtain a chest x-ray and basic labs to evaluate for hypokalemia.  The patient's EKG interpreted by me shows a sinus rhythm with a rate of 83 with normal axis, normal intervals, nonspecific ST-T changes.  Will reevaluate for ultimate disposition.  The patient's labs here are reassuring.  Chest x-ray shows questionable small infiltrate which is consistent with his COVID infection.  COVID test is positive here as well.  The patient was ambulatory here with normal pulse ox and was feeling better.  I think he is stable for discharge.  Prior to discharge when repeat vitals were obtained the patient's oxygen saturations were down to 87 to 88% on room air.  The patient is given a DuoNeb.  On reassessment afterwards his pulse ox remains in the high 80s.  The patient is placed on nasal cannula oxygen.  Calls placed to hospital  service for admission.  He is given Decadron .  Amount and/or Complexity of Data Reviewed Labs: ordered. Radiology: ordered.  Risk Prescription drug management. Decision regarding hospitalization.        Final diagnoses:  COVID-19    ED Discharge Orders     None          Ula Prentice SAUNDERS, MD 10/13/23 2241    Ula Prentice SAUNDERS, MD 10/13/23 2329    Ula Prentice SAUNDERS, MD 10/13/23 2329

## 2023-10-13 NOTE — Discharge Instructions (Signed)
 Your workup today was reassuring.  Please drink plenty of fluids and follow-up with your doctor.  Return to the ER for worsening symptoms.

## 2023-10-13 NOTE — ED Triage Notes (Addendum)
 Pt BIBA from home for weakness and altered mental status.  Pt began feeling unwell at the beach yesterday.  Family took him to [place] on the way home today where he was diagnosed with COVID, Low K and Low NA.   Pt reports not taking K prescription while at the beach.  Pt reports taking tylenol  earlier today, was febrile prior to taking med When Pt arrived home he was unable to rise from sitting so family called EMS  V/S within range for EMS  IV in place, 1 L of LR given in route

## 2023-10-14 ENCOUNTER — Inpatient Hospital Stay (HOSPITAL_COMMUNITY)

## 2023-10-14 ENCOUNTER — Other Ambulatory Visit: Payer: Self-pay

## 2023-10-14 DIAGNOSIS — U071 COVID-19: Secondary | ICD-10-CM | POA: Diagnosis not present

## 2023-10-14 DIAGNOSIS — R531 Weakness: Secondary | ICD-10-CM

## 2023-10-14 DIAGNOSIS — J9601 Acute respiratory failure with hypoxia: Secondary | ICD-10-CM | POA: Diagnosis not present

## 2023-10-14 LAB — COMPREHENSIVE METABOLIC PANEL WITH GFR
ALT: 17 U/L (ref 0–44)
AST: 24 U/L (ref 15–41)
Albumin: 3.3 g/dL — ABNORMAL LOW (ref 3.5–5.0)
Alkaline Phosphatase: 59 U/L (ref 38–126)
Anion gap: 13 (ref 5–15)
BUN: 13 mg/dL (ref 8–23)
CO2: 22 mmol/L (ref 22–32)
Calcium: 8.9 mg/dL (ref 8.9–10.3)
Chloride: 98 mmol/L (ref 98–111)
Creatinine, Ser: 1.05 mg/dL (ref 0.61–1.24)
GFR, Estimated: >60 mL/min (ref >60)
Glucose, Bld: 186 mg/dL — ABNORMAL HIGH (ref 70–99)
Potassium: 3.2 mmol/L — ABNORMAL LOW (ref 3.5–5.1)
Sodium: 133 mmol/L — ABNORMAL LOW (ref 135–145)
Total Bilirubin: 0.8 mg/dL (ref 0.0–1.2)
Total Protein: 6.1 g/dL — ABNORMAL LOW (ref 6.5–8.1)

## 2023-10-14 LAB — CBC
HCT: 44 % (ref 39.0–52.0)
Hemoglobin: 15.1 g/dL (ref 13.0–17.0)
MCH: 32.4 pg (ref 26.0–34.0)
MCHC: 34.3 g/dL (ref 30.0–36.0)
MCV: 94.4 fL (ref 80.0–100.0)
Platelets: 159 K/uL (ref 150–400)
RBC: 4.66 MIL/uL (ref 4.22–5.81)
RDW: 11.9 % (ref 11.5–15.5)
WBC: 7.2 K/uL (ref 4.0–10.5)
nRBC: 0 % (ref 0.0–0.2)

## 2023-10-14 LAB — DIFFERENTIAL
Abs Immature Granulocytes: 0.04 K/uL (ref 0.00–0.07)
Basophils Absolute: 0 K/uL (ref 0.0–0.1)
Basophils Relative: 0 %
Eosinophils Absolute: 0 K/uL (ref 0.0–0.5)
Eosinophils Relative: 0 %
Immature Granulocytes: 1 %
Lymphocytes Relative: 9 %
Lymphs Abs: 0.7 K/uL (ref 0.7–4.0)
Monocytes Absolute: 0.1 K/uL (ref 0.1–1.0)
Monocytes Relative: 2 %
Neutro Abs: 6.4 K/uL (ref 1.7–7.7)
Neutrophils Relative %: 88 %

## 2023-10-14 LAB — PHOSPHORUS: Phosphorus: 3.4 mg/dL (ref 2.5–4.6)

## 2023-10-14 LAB — BRAIN NATRIURETIC PEPTIDE: B Natriuretic Peptide: 269.9 pg/mL — ABNORMAL HIGH (ref 0.0–100.0)

## 2023-10-14 LAB — GLUCOSE, CAPILLARY
Glucose-Capillary: 171 mg/dL — ABNORMAL HIGH (ref 70–99)
Glucose-Capillary: 228 mg/dL — ABNORMAL HIGH (ref 70–99)

## 2023-10-14 LAB — PROCALCITONIN: Procalcitonin: 0.1 ng/mL

## 2023-10-14 LAB — MAGNESIUM: Magnesium: 2.2 mg/dL (ref 1.7–2.4)

## 2023-10-14 MED ORDER — CHLORTHALIDONE 25 MG PO TABS
12.5000 mg | ORAL_TABLET | Freq: Every day | ORAL | Status: DC
  Administered 2023-10-14: 12.5 mg via ORAL
  Filled 2023-10-14: qty 0.5

## 2023-10-14 MED ORDER — AMLODIPINE BESYLATE 10 MG PO TABS
10.0000 mg | ORAL_TABLET | Freq: Every day | ORAL | Status: DC
  Administered 2023-10-14: 10 mg via ORAL
  Filled 2023-10-14: qty 1

## 2023-10-14 MED ORDER — IPRATROPIUM-ALBUTEROL 0.5-2.5 (3) MG/3ML IN SOLN: 3.0000 mL | Freq: Four times a day (QID) | RESPIRATORY_TRACT | Status: DC | PRN

## 2023-10-14 MED ORDER — ENOXAPARIN SODIUM 40 MG/0.4ML IJ SOSY
40.0000 mg | PREFILLED_SYRINGE | INTRAMUSCULAR | Status: DC
  Administered 2023-10-14: 40 mg via SUBCUTANEOUS
  Filled 2023-10-14: qty 0.4

## 2023-10-14 MED ORDER — SODIUM CHLORIDE 0.9 % IV SOLN
200.0000 mg | Freq: Once | INTRAVENOUS | Status: AC
  Administered 2023-10-14: 200 mg via INTRAVENOUS
  Filled 2023-10-14: qty 40

## 2023-10-14 MED ORDER — ONDANSETRON HCL 4 MG/2ML IJ SOLN: 4.0000 mg | Freq: Four times a day (QID) | INTRAMUSCULAR | Status: DC | PRN

## 2023-10-14 MED ORDER — PANTOPRAZOLE SODIUM 40 MG PO TBEC
40.0000 mg | DELAYED_RELEASE_TABLET | Freq: Every day | ORAL | Status: DC
  Filled 2023-10-14: qty 1

## 2023-10-14 MED ORDER — POTASSIUM CHLORIDE CRYS ER 20 MEQ PO TBCR
20.0000 meq | EXTENDED_RELEASE_TABLET | Freq: Three times a day (TID) | ORAL | Status: DC
  Administered 2023-10-14 (×2): 20 meq via ORAL
  Filled 2023-10-14 (×2): qty 1

## 2023-10-14 MED ORDER — ONDANSETRON HCL 4 MG PO TABS
4.0000 mg | ORAL_TABLET | Freq: Four times a day (QID) | ORAL | Status: DC | PRN
Start: 2023-10-14 — End: 2023-10-14

## 2023-10-14 MED ORDER — LEVOTHYROXINE SODIUM 100 MCG PO TABS
100.0000 ug | ORAL_TABLET | Freq: Every day | ORAL | Status: DC
  Administered 2023-10-14: 100 ug via ORAL
  Filled 2023-10-14: qty 1

## 2023-10-14 MED ORDER — ACETAMINOPHEN 650 MG RE SUPP: 650.0000 mg | Freq: Four times a day (QID) | RECTAL | Status: DC | PRN

## 2023-10-14 MED ORDER — INSULIN ASPART 100 UNIT/ML IJ SOLN: 0.0000 [IU] | Freq: Three times a day (TID) | INTRAMUSCULAR | Status: DC

## 2023-10-14 MED ORDER — DEXAMETHASONE 4 MG PO TABS
6.0000 mg | ORAL_TABLET | ORAL | Status: DC
  Administered 2023-10-14: 6 mg via ORAL
  Filled 2023-10-14: qty 1

## 2023-10-14 MED ORDER — ACETAMINOPHEN 325 MG PO TABS: 650.0000 mg | ORAL_TABLET | Freq: Four times a day (QID) | ORAL | Status: DC | PRN

## 2023-10-14 MED ORDER — SENNOSIDES-DOCUSATE SODIUM 8.6-50 MG PO TABS: 1.0000 | ORAL_TABLET | Freq: Every evening | ORAL | Status: DC | PRN

## 2023-10-14 MED ORDER — SODIUM CHLORIDE 0.9 % IV SOLN: 100.0000 mg | Freq: Every day | INTRAVENOUS | Status: DC

## 2023-10-14 NOTE — Evaluation (Signed)
 Physical Therapy One Time Evaluation Patient Details Name: Adam Edwards MRN: [id number] DOB: 1946/01/23 Today's Date: 10/14/2023  History of Present Illness  78 y.o. male with medical history significant for HTN, HLD, hypothyroidism, hypokalemia, obesity, osteoarthritis, PTSD, GERD and recently diagnosed COVID infection who presented to the ED for evaluation of generalized weakness and found to have hypokalemia and a positive COVID test  Clinical Impression  Patient evaluated by Physical Therapy with no further acute PT needs identified. All education has been completed and the patient has no further questions.  Pt mobilizing well and SPO2 remained in upper 90s during session with pt on room air.  Pt denies dyspnea, reports feeling better and eager for d/c home. No further follow-up Physical Therapy or equipment needs. PT is signing off. Thank you for this referral.         If plan is discharge home, recommend the following:     Can travel by private vehicle        Equipment Recommendations None recommended by PT  Recommendations for Other Services       Functional Status Assessment Patient has not had a recent decline in their functional status     Precautions / Restrictions Precautions Precautions: None      Mobility  Bed Mobility Overal bed mobility: Modified Independent                  Transfers Overall transfer level: Modified independent                      Ambulation/Gait Ambulation/Gait assistance: Supervision, Modified independent (Device/Increase time) Gait Distance (Feet): 360 Feet Assistive device: None Gait Pattern/deviations: WFL(Within Functional Limits)       General Gait Details: no LOB or unsteadiness observed, SpO2 99% on room air with standing at EOB for 2 minutes and then 97% on room air upon return to room after ambulating in hallway; pt denies symptoms  Stairs            Wheelchair Mobility     Tilt Bed     Modified Rankin (Stroke Patients Only)       Balance Overall balance assessment: No apparent balance deficits (not formally assessed)                                           Pertinent Vitals/Pain Pain Assessment Pain Assessment: No/denies pain    Home Living Family/patient expects to be discharged to:: Private residence Living Arrangements: Spouse/significant other Available Help at Discharge: Family Type of Home: House         Home Layout: Able to live on main level with bedroom/bathroom Home Equipment: None      Prior Function Prior Level of Function : Independent/Modified Independent                     Extremity/Trunk Assessment   Upper Extremity Assessment Upper Extremity Assessment: Overall WFL for tasks assessed    Lower Extremity Assessment Lower Extremity Assessment: Overall WFL for tasks assessed    Cervical / Trunk Assessment Cervical / Trunk Assessment: Normal  Communication   Communication Communication: Impaired Factors Affecting Communication: Hearing impaired    Cognition Arousal: Alert Behavior During Therapy: WFL for tasks assessed/performed   PT - Cognitive impairments: No apparent impairments  Following commands: Intact       Cueing       General Comments      Exercises     Assessment/Plan    PT Assessment Patient does not need any further PT services  PT Problem List         PT Treatment Interventions      PT Goals (Current goals can be found in the Care Plan section)  Acute Rehab PT Goals PT Goal Formulation: All assessment and education complete, DC therapy    Frequency       Co-evaluation               AM-PAC PT 6 Clicks Mobility  Outcome Measure Help needed turning from your back to your side while in a flat bed without using bedrails?: None Help needed moving from lying on your back to sitting on the side of a flat bed without using  bedrails?: None Help needed moving to and from a bed to a chair (including a wheelchair)?: None Help needed standing up from a chair using your arms (e.g., wheelchair or bedside chair)?: None Help needed to walk in hospital room?: A Little Help needed climbing 3-5 steps with a railing? : A Little 6 Click Score: 22    End of Session Equipment Utilized During Treatment: Gait belt Activity Tolerance: Patient tolerated treatment well Patient left: in chair;with call bell/phone within reach;with chair alarm set;with family/visitor present Nurse Communication: Mobility status PT Visit Diagnosis: Difficulty in walking, not elsewhere classified (R26.2)    Time: 8943-8888 PT Time Calculation (min) (ACUTE ONLY): 15 min   Charges:   PT Evaluation $PT Eval Low Complexity: 1 Low   PT General Charges $$ ACUTE PT VISIT: 1 Visit        Tari KLEIN, DPT Physical Therapist Acute Rehabilitation Services Office: [phone]   Tari CROME [place] 10/14/2023, 12:58 PM

## 2023-10-14 NOTE — TOC Progression Note (Signed)
 Transition of Care [facility]) - Progression Note    Patient Details  Name: Adam Edwards MRN: [id number] Date of Birth: 1945/11/16  [facility] CM/SW Contact  Sonda Manuella Quill, RN Phone Number: 10/14/2023, 4:42 PM  Clinical Narrative:    Pt d/c before TOC assessment completed.        Expected Discharge Plan and Services         Expected Discharge Date: 10/14/23                                     Social Determinants of Health (SDOH) Interventions SDOH Screenings   Food Insecurity: No Food Insecurity (10/14/2023)  Housing: Low Risk  (10/14/2023)  Transportation Needs: No Transportation Needs (10/14/2023)  Utilities: Not At Risk (10/14/2023)  Tobacco Use: Low Risk  (10/13/2023)    Readmission Risk Interventions     No data to display

## 2023-10-14 NOTE — Hospital Course (Signed)
 Real weak. Started this past Friday.  Started havign a fever Friday to Saturday. Coming home Saturday. Could barely walk on Saturday went to [place]. Was sent home.   Legs were real weak.   Forgotten to take potassium pills while at the beach.   Altered yesterday could not remember what's going on.

## 2023-10-15 LAB — HEMOGLOBIN A1C
Hgb A1c MFr Bld: 5.2 % (ref 4.8–5.6)
Mean Plasma Glucose: 103 mg/dL

## 2023-10-17 NOTE — Discharge Summary (Signed)
 Physician Discharge Summary   Patient: Adam Edwards MRN: [id number] DOB: 06-12-1945  Admit date:     10/13/2023  Discharge date: 10/14/2023  Discharge Physician: Alban Pepper   PCP: [facility]   Recommendations at discharge:    BMP to ensure potassium is stable.   Discharge Diagnoses: Principal Problem:   Acute respiratory failure with hypoxia (HCC) Active Problems:   Generalized weakness  Resolved Problems:   * No resolved hospital problems. [facility] Hospital Course: Real weak. Started this past Friday.  Started havign a fever Friday to Saturday. Coming home Saturday. Could barely walk on Saturday went to [place]. Was sent home.   Legs were real weak.   Forgotten to take potassium pills while at the beach.   Altered yesterday could not remember what's going on.   Assessment and Plan:  Acute hypoxic respiratory failure POA Patient found to have new oxygen requirement on admission in the setting of COVID infection. On the day of discharge patient ambulated on the unit with no dyspnea and maintained O2 sats in the mid 90s. Patient will continue to check his oxygen saturations at home.   COVID 19 infection  Patient p/w general malaise, subjective fever, and hypoxia. He was initially started on remdesivir  and dexamethasone . On the day of discharge patient was able to ambulate without any difficulty and he had no dyspnea. Remdesivir  and dexamethasone  were held in addition to other COVID specific treatments. Patient was discharged with instructions to check his pulse ox levels daily at rest and after ambulation. If this number was persistently less than 92% he was told to go to the ED.   Chronic hypokalemia  Patient reportedly had not been taking his potassium supplementation. His potassium was 3.6 on admission but this sample was noted to be hemolyzed. His potassium on the day of discharge was 3.2.  It is possible that his potassium levels contributed to his presenting  symptoms as well. Patient was restarted on his home potassium supplementation.    HTN His home medications were continued.    Hypothyroidism Home Synthroid  was continued.    GERD Continued home PPI.    Generalized weakness Resolved on the day of discharge.  His potassium was supplemented. COVID specific treatments were held on discharge.    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       Consultants: [facility] Procedures performed: None  Disposition: Home Diet recommendation:  Regular diet DISCHARGE MEDICATION: Allergies as of 10/14/2023   No Known Allergies      Medication List     STOP taking these medications    pantoprazole  40 MG tablet Commonly known as: PROTONIX        TAKE these medications    albuterol  108 (90 Base) MCG/ACT inhaler Commonly known as: VENTOLIN  HFA Inhale 2 puffs into the lungs every 6 (six) hours as needed for wheezing or shortness of breath. What changed: Another medication with the same name was added. Make sure you understand how and when to take each.   albuterol  108 (90 Base) MCG/ACT inhaler Commonly known as: VENTOLIN  HFA Inhale 2 puffs into the lungs every 4 (four) hours as needed for wheezing or shortness of breath. What changed: You were already taking a medication with the same name, and this prescription was added. Make sure you understand how and when to take each.   amLODipine  10 MG tablet Commonly known as: NORVASC  Take 10 mg  by mouth daily.   atorvastatin  40 MG tablet Commonly known as: LIPITOR Take 40 mg by mouth at bedtime.   chlorthalidone  25 MG tablet Commonly known as: HYGROTON  Take 12.5 mg by mouth daily.   Fish Oil 1000 MG Caps Take 2,000 mg by mouth in the morning and at bedtime.   FLUoxetine  20 MG capsule Commonly known as: PROZAC  Take 20 mg by mouth daily.   hydrOXYzine 25 MG capsule Commonly known as: VISTARIL Take 25 mg by mouth daily  as needed for itching.   levothyroxine  100 MCG tablet Commonly known as: SYNTHROID  Take 100 mcg by mouth daily before breakfast.   omeprazole 20 MG capsule Commonly known as: PRILOSEC Take 20 mg by mouth daily as needed (acid reflux). Take on empty stomach 30 minutes prior to a meal   potassium chloride  SA 20 MEQ tablet Commonly known as: KLOR-CON  M Take 20 mEq by mouth 3 (three) times daily with meals.        Follow-up Information     Schedule an appointment as soon as possible for a visit  with [facility]   Contact information: [address] [facility] [place] [address] [phone]                Discharge Exam: Fredricka Weights   10/13/23 1820  Weight: 99 kg   Physical Exam  Constitutional: In no distress. Walking around in his room. Cardiovascular: Normal rate, regular rhythm. No lower extremity edema  Pulmonary: Non labored breathing on room air, no wheezing or rales.   Abdominal: Soft. Normal bowel sounds. Non distended and non tender Musculoskeletal: Normal range of motion.     Neurological: Alert and oriented to person, place, and time. Non focal  Skin: Skin is warm and dry.    Condition at discharge: good  The results of significant diagnostics from this hospitalization (including imaging, microbiology, ancillary and laboratory) are listed below for reference.   Imaging Studies: DG CHEST PORT 1 VIEW Result Date: 10/14/2023 CLINICAL DATA:  Hypoxemia. EXAM: PORTABLE CHEST 1 VIEW COMPARISON:  Radiograph 10/13/2023, CT 03/05/2020 FINDINGS: And stable heart size and mediastinal contours. The question left lung opacity is not seen on the current exam. No evidence of acute or developing airspace disease. Bronchovascular crowding versus vascular congestion. No frank pulmonary edema. No pleural fluid or pneumothorax. IMPRESSION: Bronchovascular crowding versus vascular congestion. Previous questioned left lung airspace disease is not  seen on the current exam. Electronically Signed   By: Andrea Gasman M.D.   On: 10/14/2023 16:53   DG Chest Portable 1 View Result Date: 10/13/2023 CLINICAL DATA:  Weakness, shortness of breath, altered mental status EXAM: PORTABLE CHEST 1 VIEW COMPARISON:  03/09/2020 FINDINGS: Stable cardiomediastinal silhouette. Pulmonary vascular congestion. Low lung volumes. Question airspace opacity versus confluence of shadows in the left mid lung laterally. Bibasilar atelectasis. No pleural effusion or pneumothorax. No displaced rib fractures. IMPRESSION: 1. Question airspace opacity versus confluence of shadows in the left mid lung laterally. Consider further evaluation with CT. Electronically Signed   By: Norman Gatlin M.D.   On: 10/13/2023 18:36    Microbiology: Results for orders placed or performed during the hospital encounter of 10/13/23  Resp panel by RT-PCR (RSV, Flu A&B, Covid) Anterior Nasal Swab     Status: Abnormal   Collection Time: 10/13/23  9:09 PM   Specimen: Anterior Nasal Swab  Result Value Ref Range Status   SARS Coronavirus 2 by RT PCR POSITIVE (A) NEGATIVE Final    Comment: (  NOTE) SARS-CoV-2 target nucleic acids are DETECTED.  The SARS-CoV-2 RNA is generally detectable in upper respiratory specimens during the acute phase of infection. Positive results are indicative of the presence of the identified virus, but do not rule out bacterial infection or co-infection with other pathogens not detected by the test. Clinical correlation with patient history and other diagnostic information is necessary to determine patient infection status. The expected result is Negative.  Fact Sheet for Patients: BloggerCourse.com  Fact Sheet for Healthcare Providers: SeriousBroker.it  This test is not yet approved or cleared by the [place]  FDA and  has been authorized for detection and/or diagnosis of SARS-CoV-2 by FDA under an  Emergency Use Authorization (EUA).  This EUA will remain in effect (meaning this test can be used) for the duration of  the COVID-19 declaration under Section 564(b)(1) of the A ct, 21 U.S.C. section 360bbb-3(b)(1), unless the authorization is terminated or revoked sooner.     Influenza A by PCR NEGATIVE NEGATIVE Final   Influenza B by PCR NEGATIVE NEGATIVE Final    Comment: (NOTE) The Xpert Xpress SARS-CoV-2/FLU/RSV plus assay is intended as an aid in the diagnosis of influenza from Nasopharyngeal swab specimens and should not be used as a sole basis for treatment. Nasal washings and aspirates are unacceptable for Xpert Xpress SARS-CoV-2/FLU/RSV testing.  Fact Sheet for Patients: BloggerCourse.com  Fact Sheet for Healthcare Providers: SeriousBroker.it  This test is not yet approved or cleared by the [place]  FDA and has been authorized for detection and/or diagnosis of SARS-CoV-2 by FDA under an Emergency Use Authorization (EUA). This EUA will remain in effect (meaning this test can be used) for the duration of the COVID-19 declaration under Section 564(b)(1) of the Act, 21 U.S.C. section 360bbb-3(b)(1), unless the authorization is terminated or revoked.     Resp Syncytial Virus by PCR NEGATIVE NEGATIVE Final    Comment: (NOTE) Fact Sheet for Patients: BloggerCourse.com  Fact Sheet for Healthcare Providers: SeriousBroker.it  This test is not yet approved or cleared by the [place]  FDA and has been authorized for detection and/or diagnosis of SARS-CoV-2 by FDA under an Emergency Use Authorization (EUA). This EUA will remain in effect (meaning this test can be used) for the duration of the COVID-19 declaration under Section 564(b)(1) of the Act, 21 U.S.C. section 360bbb-3(b)(1), unless the authorization is terminated or revoked.  Performed at [facility], [address] [place], [place] [address]     Labs: CBC: Recent Labs  Lab 10/13/23 1822 10/14/23 0716  WBC 11.5* 7.2  NEUTROABS  --  6.4  HGB 15.9 15.1  HCT 45.4 44.0  MCV 93.2 94.4  PLT 168 159   Basic Metabolic Panel: Recent Labs  Lab 10/13/23 1822 10/14/23 0716  NA 133* 133*  K 3.6 3.2*  CL 95* 98  CO2 26 22  GLUCOSE 111* 186*  BUN 13 13  CREATININE 1.02 1.05  CALCIUM  9.2 8.9  MG  --  2.2  PHOS  --  3.4   Liver Function Tests: Recent Labs  Lab 10/14/23 0716  AST 24  ALT 17  ALKPHOS 59  BILITOT 0.8  PROT 6.1*  ALBUMIN 3.3*   CBG: Recent Labs  Lab 10/14/23 0752 10/14/23 1133  GLUCAP 171* 228*    Discharge time spent: greater than 30 minutes.  Signed: Alban Pepper, MD [facility] 10/17/2023
